# Patient Record
Sex: Female | Born: 1945 | Race: Black or African American | Hispanic: No | Marital: Single | Smoking: Never smoker
Health system: Southern US, Community
[De-identification: ages and names within clinical notes are randomized; demographics above are authoritative.]

## PROBLEM LIST (undated history)

## (undated) ENCOUNTER — Emergency Department (HOSPITAL_COMMUNITY): Discharge status: Absent without leave | Payer: Medicare Other

## (undated) ENCOUNTER — Ambulatory Visit: Discharge status: Absent without leave | Payer: Medicare Other

## (undated) HISTORY — PX: BREAST EXCISIONAL BIOPSY: SUR124

## (undated) HISTORY — PX: ABDOMINAL HYSTERECTOMY: SHX81

## (undated) HISTORY — PX: BREAST BIOPSY: SHX20

---

## 1998-11-16 ENCOUNTER — Other Ambulatory Visit: Admission: RE | Admit: 1998-11-16 | Discharge: 1998-11-16 | Payer: Self-pay | Admitting: Obstetrics and Gynecology

## 2001-02-10 ENCOUNTER — Encounter: Payer: Self-pay | Admitting: Unknown Physician Specialty

## 2001-02-10 ENCOUNTER — Ambulatory Visit (HOSPITAL_COMMUNITY): Admission: RE | Admit: 2001-02-10 | Discharge: 2001-02-10 | Payer: Self-pay | Admitting: Unknown Physician Specialty

## 2003-06-09 ENCOUNTER — Ambulatory Visit (HOSPITAL_COMMUNITY): Admission: RE | Admit: 2003-06-09 | Discharge: 2003-06-09 | Payer: Self-pay | Admitting: Family Medicine

## 2003-06-23 ENCOUNTER — Ambulatory Visit (HOSPITAL_COMMUNITY): Admission: RE | Admit: 2003-06-23 | Discharge: 2003-06-23 | Payer: Self-pay | Admitting: Family Medicine

## 2004-08-28 ENCOUNTER — Ambulatory Visit (HOSPITAL_COMMUNITY): Admission: RE | Admit: 2004-08-28 | Discharge: 2004-08-28 | Payer: Self-pay | Admitting: Nurse Practitioner

## 2004-12-08 ENCOUNTER — Emergency Department (HOSPITAL_COMMUNITY): Admission: EM | Admit: 2004-12-08 | Discharge: 2004-12-08 | Payer: Self-pay | Admitting: Emergency Medicine

## 2005-10-03 ENCOUNTER — Ambulatory Visit (HOSPITAL_COMMUNITY): Admission: RE | Admit: 2005-10-03 | Discharge: 2005-10-03 | Payer: Self-pay | Admitting: Family Medicine

## 2006-10-30 ENCOUNTER — Ambulatory Visit: Payer: Self-pay | Admitting: Internal Medicine

## 2006-11-13 ENCOUNTER — Ambulatory Visit: Payer: Self-pay | Admitting: Internal Medicine

## 2006-11-13 ENCOUNTER — Encounter (INDEPENDENT_AMBULATORY_CARE_PROVIDER_SITE_OTHER): Payer: Self-pay | Admitting: Specialist

## 2006-11-13 ENCOUNTER — Ambulatory Visit (HOSPITAL_COMMUNITY): Admission: RE | Admit: 2006-11-13 | Discharge: 2006-11-13 | Payer: Self-pay | Admitting: Internal Medicine

## 2006-11-25 ENCOUNTER — Ambulatory Visit (HOSPITAL_COMMUNITY): Admission: RE | Admit: 2006-11-25 | Discharge: 2006-11-25 | Payer: Self-pay | Admitting: Family Medicine

## 2008-08-17 ENCOUNTER — Ambulatory Visit (HOSPITAL_COMMUNITY): Admission: RE | Admit: 2008-08-17 | Discharge: 2008-08-17 | Payer: Self-pay | Admitting: Family Medicine

## 2009-12-20 ENCOUNTER — Ambulatory Visit (HOSPITAL_COMMUNITY): Admission: RE | Admit: 2009-12-20 | Discharge: 2009-12-20 | Payer: Self-pay | Admitting: Family Medicine

## 2010-10-20 ENCOUNTER — Ambulatory Visit (HOSPITAL_COMMUNITY)
Admission: RE | Admit: 2010-10-20 | Discharge: 2010-10-20 | Payer: Self-pay | Source: Home / Self Care | Attending: Family Medicine | Admitting: Family Medicine

## 2010-12-26 ENCOUNTER — Other Ambulatory Visit (HOSPITAL_COMMUNITY): Payer: Self-pay | Admitting: Family Medicine

## 2010-12-26 DIAGNOSIS — Z139 Encounter for screening, unspecified: Secondary | ICD-10-CM

## 2011-01-02 ENCOUNTER — Ambulatory Visit (HOSPITAL_COMMUNITY)
Admission: RE | Admit: 2011-01-02 | Discharge: 2011-01-02 | Disposition: A | Discharge status: Home / Self Care | Payer: PRIVATE HEALTH INSURANCE | Source: Ambulatory Visit | Attending: Family Medicine | Admitting: Family Medicine

## 2011-01-02 DIAGNOSIS — Z139 Encounter for screening, unspecified: Secondary | ICD-10-CM

## 2011-01-02 DIAGNOSIS — Z1231 Encounter for screening mammogram for malignant neoplasm of breast: Secondary | ICD-10-CM | POA: Insufficient documentation

## 2011-02-23 NOTE — Op Note (Signed)
Margaret Murray, Margaret Murray             ACCOUNT NO.:  [id number]   MEDICAL RECORD NO.:  [id number]          PATIENT TYPE:  AMB   LOCATION:  DAY                           FACILITY:  [facility]   PHYSICIAN:  R. Roetta Sessions, M.D. DATE OF BIRTH:  May 12, 1946   DATE OF PROCEDURE:  11/13/2006  DATE OF DISCHARGE:                               OPERATIVE REPORT   PROCEDURE:  Diagnostic colonoscopy.   INDICATIONS FOR PROCEDURE:  The patient is a 65 year old lady with no  prior history of colonoscopy, although she has sigmoidoscopy many years  ago.  She has had intermittent severe proctalgia and possible  hematochezia.  This was a month ago.  She tells me the last couple of  weeks her symptoms have subsided.  She is having one bowel movement a  day, has not had any tennismus or rectal bleeding.  Colonoscopy is now  being done.  This approach has been discussed with the patient at  length.  Potential risks, benefits and alternatives was have been  reviewed, questions answered.  She is agreeable.  Please see  documentation on medical record.   PROCEDURE NOTE:  O2 saturation, blood pressure, pulse and respirations  were monitored throughout the entire procedure.   CONSCIOUS SEDATION:  Versed 8 mg IV, Demerol 75 mg IV in divided doses.   INSTRUMENT:  Pentax video chip system.   FINDINGS:  Digital rectal exam revealed no abnormalities.   ENDOSCOPIC FINDINGS:  The prep was excellent.  Examination of rectal  mucosa revealed some submucosal petechiae that were certain  circumferential and involved the distal one half the rectal mucosa.  Please see photos.  This was seen initially and was not related to scope  trauma and not consistent with enema tip trauma.  Examination of the  colonic mucosa from the rectosigmoid junction was undertaken.  The  colonoscope was advanced from the rectosigmoid junction through the left  transverse, right colon to the appendiceal orifice, ileocecal valve and  cecum.  These  structures were well seen photographed.  From this level,  the scope was slowly withdrawn.  All previously mentioned mucosal  surfaces were again seen.  The scope was pulled down into the rectum  where thorough examination of the rectal mucosa including retroflexed  view of the anal verge was undertaken.  Aside from the petechiae, the  rectal mucosa appeared normal.  There were no erosions.  There were no  ulcerations and the vascular pattern was preserved.  The colonic mucosa  appeared entirely normal.  The rectal mucosa was biopsied for histologic  study.  The patient tolerated the procedure well and was reactive.   ENDOSCOPIC IMPRESSION:  1. Distal rectal mucosal petechiae of uncertain significance, was      biopsied, otherwise normal rectum.  2. Normal colon.   RECOMMENDATIONS:  1. Follow-up on path.  2. Further recommendations to follow.      Jonathon Bellows, M.D.  Electronically Signed     RMR/MEDQ  D:  11/13/2006  T:  11/13/2006  Job:  [id number]   cc:   Marchia Meiers  [facility] Dept.  Michell Heinrich,  [place]

## 2011-02-23 NOTE — H&P (Signed)
Margaret Murray, Margaret Murray             ACCOUNT NO.:  [id number]   MEDICAL RECORD NO.:  [id number]         PATIENT TYPE:  AMB   LOCATION:                                FACILITY:  [facility]   PHYSICIAN:  R. Roetta Sessions, M.D. DATE OF BIRTH:  30-Aug-1946   DATE OF ADMISSION:  DATE OF DISCHARGE:  LH                              HISTORY & PHYSICAL   REQUESTING PHYSICIAN:  Dierdre Forth of the [facility]  Department.   CHIEF COMPLAINT:  Margaret Murray is a 65 year old female.  Around December  15th, she developed severe proctalgia.  She felt as if she had swelling  in her rectum.  She noticed something that looked like blood in her  stool.  She had also eaten watermelon that day and is unsure as to  whether this truly was bleeding or not.  She has never had a  colonoscopy.  She had a normal flex sig in 1994.  She denies any  abdominal pain.  Her bowel movements are generally soft and brown on a  daily basis.  She denies any family history of colon cancer.   PAST MEDICAL HISTORY:  Uterine fibroids, status post partial  hysterectomy.  Right benign breast biopsy.   CURRENT MEDICATIONS:  Super [employer] supplements daily.   ALLERGIES:  No known drug allergies.   FAMILY HISTORY:  No known family history of colorectal cancer, liver  problems.  Her father deceased at age 40 secondary to pancreatic  carcinoma.  Mother is alive with diabetes mellitus.  She has seven  healthy siblings.   SOCIAL HISTORY:  Margaret Murray is single.  She lives alone.  She has a  healthy 44 year old child.  She is self-employed.  Sells books.  She  denies any alcohol or drug use.   REVIEW OF SYSTEMS:  CONSTITUTIONAL:  Stable.  Denies any fevers or  chills.  Denies any fatigue.  CARDIOVASCULAR:  Denies any chest pain or  palpitations.  PULMONARY:  Denies dyspnea, cough, hemoptysis.  GI:  See  HPI.  Denies any nausea, vomiting, heartburn, indigestion, dysphagia,  odynophagia, anorexia, or early satiety.   PHYSICAL  EXAMINATION:  VITAL SIGNS:  Weight 134.  Height 62-1/2.  Temp  98.2.  Blood pressure 116/74.  Pulse 88.  GENERAL:  She is a well-developed and well-nourished African-American  female in no acute distress.  HEENT:  Sclerae are clear and nonicteric.  Conjunctivae are pink.  Oropharynx pink and moist without any lesions.  NECK:  Supple.  No mass or thyromegaly.  CHEST:  Heart regular rate and rhythm with normal S1 and S2 without  murmurs, clicks, rubs, or gallops.  LUNGS:  Clear to auscultation bilaterally.  ABDOMEN:  Positive bowel sounds x4.  No bruits auscultated.  Soft,  nontender, nondistended without palpable mass or hepatosplenomegaly.  No  rebound tenderness or guarding.  EXTREMITIES:  Without clubbing or edema bilaterally.  SKIN:  Warm and dry without any rash or jaundice.   IMPRESSION:  Margaret Murray is a 65 year old female with history of severe  proctalgia and possible hematochezia for a brief episode.  I suspect we  are dealing with a benign anorectal source such as hemorrhoids or  fissure; however, she has never had a colonoscopy and given her age,  would pursue further evaluation to rule out colorectal carcinoma.   PLAN:  Colonoscopy with Dr. Jena Gauss in the near future.  I have discussed  the procedure, including risks and benefits to include but not limited  to bleeding, infection, perforation, or drug reaction.  She agrees  __________ and consent will be obtained.   I would like to thank Dierdre Forth, nurse practitioner, for allowing [place]  to participate in the care of Margaret Murray.      Nicholas Lose, N.P.      Jonathon Bellows, M.D.  Electronically Signed    KC/MEDQ  D:  10/30/2006  T:  10/30/2006  Job:  [id number]

## 2011-07-22 ENCOUNTER — Encounter: Payer: Self-pay | Admitting: *Deleted

## 2011-07-22 ENCOUNTER — Emergency Department (HOSPITAL_COMMUNITY)
Admission: EM | Admit: 2011-07-22 | Discharge: 2011-07-22 | Disposition: A | Discharge status: Home / Self Care | Payer: Medicare Other | Attending: Emergency Medicine | Admitting: Emergency Medicine

## 2011-07-22 ENCOUNTER — Emergency Department (HOSPITAL_COMMUNITY): Payer: Medicare Other

## 2011-07-22 DIAGNOSIS — IMO0002 Reserved for concepts with insufficient information to code with codable children: Secondary | ICD-10-CM | POA: Insufficient documentation

## 2011-07-22 DIAGNOSIS — M545 Low back pain, unspecified: Secondary | ICD-10-CM | POA: Insufficient documentation

## 2011-07-22 DIAGNOSIS — M5416 Radiculopathy, lumbar region: Secondary | ICD-10-CM

## 2011-07-22 MED ORDER — PREDNISONE 20 MG PO TABS
40.0000 mg | ORAL_TABLET | Freq: Once | ORAL | Status: AC
  Administered 2011-07-22: 40 mg via ORAL
  Filled 2011-07-22: qty 2

## 2011-07-22 MED ORDER — PREDNISONE 20 MG PO TABS: 40.0000 mg | ORAL_TABLET | Freq: Every day | ORAL | Status: AC

## 2011-07-22 NOTE — ED Provider Notes (Signed)
History     CSN: [id number] Arrival date & time: 07/22/2011  8:59 AM  Chief Complaint  Patient presents with  . Back Pain    (Consider location/radiation/quality/duration/timing/severity/associated sxs/prior treatment) HPI Comments: Pt sat in z recliner chair ~ 2 hrs 2 days ago.  When she got up she had lower back pain which has not improved.  She describes it as constant and burning with radiation into R thigh.  Patient is a 65 y.o. female presenting with back pain. The history is provided by the patient. No language interpreter was used.  Back Pain  This is a new problem. The problem occurs constantly. The problem has not changed since onset.The pain is associated with no known injury. The pain is at a severity of 4/10. The symptoms are aggravated by certain positions. Worse during: worse with transitions of sitting or standing. Pertinent negatives include no fever and no weakness. She has tried nothing for the symptoms.    History reviewed. No pertinent past medical history.  Past Surgical History  Procedure Date  . Breast biopsy     No family history on file.  History  Substance Use Topics  . Smoking status: Never Smoker   . Smokeless tobacco: Not on file  . Alcohol Use: No    OB History    Grav Para Term Preterm Abortions TAB SAB Ect Mult Living                  Review of Systems  Constitutional: Negative for fever.  Musculoskeletal: Positive for back pain.  Neurological: Negative for weakness.  All other systems reviewed and are negative.    Allergies  Review of patient's allergies indicates no known allergies.  Home Medications  No current outpatient prescriptions on file.  BP 103/56  Pulse 69  Temp(Src) 98 F (36.7 C) (Oral)  Resp 16  Ht 5\' 3"  (1.6 m)  Wt 122 lb (55.339 kg)  BMI 21.61 kg/m2  SpO2 98%  Physical Exam  Nursing note and vitals reviewed. Constitutional: She is oriented to person, place, and time. She appears well-developed and  well-nourished. No distress.  HENT:  Head: Normocephalic and atraumatic.  Eyes: EOM are normal.  Neck: Normal range of motion.  Cardiovascular: Normal rate, regular rhythm and normal heart sounds.   Pulmonary/Chest: Effort normal and breath sounds normal.  Abdominal: Soft. She exhibits no distension. There is no tenderness.  Musculoskeletal: She exhibits tenderness.       Back:       Legs: Neurological: She is alert and oriented to person, place, and time. She has normal strength. No sensory deficit. Coordination and gait normal. GCS eye subscore is 4. GCS verbal subscore is 5. GCS motor subscore is 6.  Reflex Scores:      Patellar reflexes are 2+ on the right side and 2+ on the left side.      Achilles reflexes are 2+ on the right side and 2+ on the left side. Skin: Skin is warm and dry.  Psychiatric: She has a normal mood and affect. Judgment normal.    ED Course  Procedures (including critical care time)  Labs Reviewed - No data to display No results found.   No diagnosis found.    MDM          Worthy Rancher, PA 07/22/11 [phone]

## 2011-07-22 NOTE — ED Notes (Signed)
Pt a/ox4. Resp even and unlabored. NAD at this time. D/C instructions and Rx reviewed with pt. Pt verbalized understanding. Pt ambulated with steady gate to POV. 

## 2011-07-22 NOTE — ED Notes (Signed)
Pt states lower back pain since Friday. Unable to straighten back when standing, No known injury. Denies previous back problems. NAD

## 2011-07-22 NOTE — ED Notes (Signed)
Pt c/o pain in her lower back since Friday night. Pt states that she stood up out of the recliner and started hurting. Hurts worse to stand. Alert and oriented x 3. Skin warm and dry. Color pink.

## 2011-07-22 NOTE — ED Provider Notes (Signed)
Medical screening examination/treatment/procedure(s) were performed by non-physician practitioner and as supervising physician I was immediately available for consultation/collaboration.  Glynn Octave, MD 07/22/11 [phone]

## 2011-08-18 ENCOUNTER — Encounter (HOSPITAL_COMMUNITY): Payer: Self-pay | Admitting: *Deleted

## 2011-08-18 ENCOUNTER — Emergency Department (HOSPITAL_COMMUNITY)
Admission: EM | Admit: 2011-08-18 | Discharge: 2011-08-18 | Disposition: A | Discharge status: Home / Self Care | Payer: Medicare Other | Attending: Emergency Medicine | Admitting: Emergency Medicine

## 2011-08-18 DIAGNOSIS — S1093XA Contusion of unspecified part of neck, initial encounter: Secondary | ICD-10-CM | POA: Insufficient documentation

## 2011-08-18 DIAGNOSIS — S0003XA Contusion of scalp, initial encounter: Secondary | ICD-10-CM | POA: Insufficient documentation

## 2011-08-18 DIAGNOSIS — S0033XA Contusion of nose, initial encounter: Secondary | ICD-10-CM

## 2011-08-18 DIAGNOSIS — Z79899 Other long term (current) drug therapy: Secondary | ICD-10-CM | POA: Insufficient documentation

## 2011-08-18 DIAGNOSIS — Z041 Encounter for examination and observation following transport accident: Secondary | ICD-10-CM
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
## 2011-08-18 NOTE — ED Notes (Signed)
Pt alert and oriented x 4 with respirations even and unlabored.  Discharge instructions reviewed with patient and patient verbalized understanding.  No acute distress at this time.  Pt ambulated with steady gait to POV with family.

## 2016-05-16 ENCOUNTER — Other Ambulatory Visit: Payer: Self-pay | Admitting: Internal Medicine

## 2016-05-16 DIAGNOSIS — E2839 Other primary ovarian failure: Secondary | ICD-10-CM

## 2016-05-16 DIAGNOSIS — Z1231 Encounter for screening mammogram for malignant neoplasm of breast: Secondary | ICD-10-CM

## 2016-06-15 ENCOUNTER — Ambulatory Visit
Admission: RE | Admit: 2016-06-15 | Discharge: 2016-06-15 | Disposition: A | Discharge status: Home / Self Care | Payer: Medicare Other | Source: Ambulatory Visit | Attending: Internal Medicine | Admitting: Internal Medicine

## 2016-06-15 DIAGNOSIS — Z1231 Encounter for screening mammogram for malignant neoplasm of breast: Secondary | ICD-10-CM

## 2016-06-15 DIAGNOSIS — E2839 Other primary ovarian failure: Secondary | ICD-10-CM

## 2016-06-21 ENCOUNTER — Telehealth: Payer: Self-pay | Admitting: Internal Medicine

## 2016-06-21 NOTE — Telephone Encounter (Signed)
Received referral to schedule colonoscopy. Records placed on Dr. Marvell FullerGessner's desk for review.

## 2016-06-22 NOTE — Telephone Encounter (Signed)
Dr. Leone PayorGessner reviewed records and Pt is not due for a recall colon until 11/2016.  LM on Vmail to make Pt aware. Pt's records placed in records file folder

## 2016-10-09 ENCOUNTER — Telehealth: Payer: Self-pay | Admitting: Internal Medicine

## 2016-10-09 NOTE — Telephone Encounter (Signed)
Letter mailed to call.  

## 2016-10-09 NOTE — Telephone Encounter (Signed)
RECALL FOR TCS °

## 2016-12-05 ENCOUNTER — Telehealth: Payer: Self-pay | Admitting: Internal Medicine

## 2016-12-05 NOTE — Telephone Encounter (Signed)
[phone]  PATIENT NEEDS A TCS, ITS BEEN OVER 10 YEARS SHE STATES

## 2016-12-10 NOTE — Telephone Encounter (Signed)
Tried to call with no answer  

## 2016-12-11 ENCOUNTER — Other Ambulatory Visit: Payer: Self-pay

## 2016-12-11 DIAGNOSIS — Z1211 Encounter for screening for malignant neoplasm of colon: Secondary | ICD-10-CM

## 2016-12-11 MED ORDER — PEG 3350-KCL-NA BICARB-NACL 420 G PO SOLR
4000.0000 mL | ORAL | 0 refills | Status: DC
Start: 2016-12-11 — End: 2017-05-16

## 2016-12-11 NOTE — Telephone Encounter (Signed)
Ok to schedule.

## 2016-12-11 NOTE — Telephone Encounter (Signed)
Gastroenterology Pre-Procedure Review  Request Date: Requesting Physician:   PATIENT REVIEW QUESTIONS: The patient responded to the following health history questions as indicated:    1. Diabetes Melitis: NO 2. Joint replacements in the past 12 months: NO 3. Major health problems in the past 3 months: NO 4. Has an artificial valve or MVP: NO 5. Has a defibrillator: NO 6. Has been advised in past to take antibiotics in advance of a procedure like teeth cleaning: NO 7. Family history of colon cancer: AUNT PASSED AWAY WITH COLON CANCER 8. Alcohol Use: NO 9. History of sleep apnea: NO 10. History of coronary artery or other vascular stents placed within the last 12 months: NO    MEDICATIONS & ALLERGIES:    Patient reports the following regarding taking any blood thinners:   Plavix? NO Aspirin? NO Coumadin? NO Brilinta? NO Xarelto? NO Eliquis? NO Pradaxa? NO Savaysa? NO Effient? NO  Patient confirms/reports the following medications:  Current Outpatient Prescriptions  Medication Sig Dispense Refill  . Carica Papaya (PAPAYA ENZYMES) CHEW Chew 1 tablet by mouth 2 (two) times daily.      . Echinacea 125 MG CAPS Take 1 capsule by mouth daily.      Marland Kitchen. PRESCRIPTION MEDICATION Take 1 tablet by mouth daily. Bio Astin     . vitamin B-12 (CYANOCOBALAMIN) 1000 MCG tablet Take 1,000 mcg by mouth daily.       No current facility-administered medications for this visit.     Patient confirms/reports the following allergies:  No Known Allergies  No orders of the defined types were placed in this encounter.   AUTHORIZATION INFORMATION Primary Insurance: MEDICARE,  ID #: [id number],  Group #:  Pre-Cert / Auth required:  Pre-Cert / Auth #:   SCHEDULE INFORMATION: Procedure has been scheduled as follows:  Date: , Time:   Location:   This Gastroenterology Pre-Precedure Review Form is being routed to the following provider(s): ROURK

## 2016-12-11 NOTE — Telephone Encounter (Signed)
Pt is set up for TCS on 01/23/17 @ 8:30. She is aware.

## 2016-12-11 NOTE — Telephone Encounter (Signed)
NO PA is needed 

## 2017-01-23 ENCOUNTER — Encounter (HOSPITAL_COMMUNITY)
Admission: RE | Disposition: A | Discharge status: Home / Self Care | Payer: Self-pay | Source: Ambulatory Visit | Attending: Internal Medicine

## 2017-01-23 ENCOUNTER — Ambulatory Visit (HOSPITAL_COMMUNITY)
Admission: RE | Admit: 2017-01-23 | Discharge: 2017-01-23 | Disposition: A | Discharge status: Home / Self Care | Payer: Medicare Other | Source: Ambulatory Visit | Attending: Internal Medicine | Admitting: Internal Medicine

## 2017-01-23 ENCOUNTER — Encounter (HOSPITAL_COMMUNITY): Payer: Self-pay | Admitting: *Deleted

## 2017-01-23 DIAGNOSIS — Z1211 Encounter for screening for malignant neoplasm of colon: Secondary | ICD-10-CM | POA: Diagnosis present

## 2017-01-23 DIAGNOSIS — Z1212 Encounter for screening for malignant neoplasm of rectum: Secondary | ICD-10-CM

## 2017-01-23 HISTORY — PX: COLONOSCOPY: SHX5424

## 2017-01-23 SURGERY — COLONOSCOPY
Anesthesia: Moderate Sedation

## 2017-01-23 MED ORDER — STERILE WATER FOR IRRIGATION IR SOLN
Status: DC | PRN
  Administered 2017-01-23: 09:00:00

## 2017-01-23 MED ORDER — SODIUM CHLORIDE 0.9 % IV SOLN
INTRAVENOUS | Status: DC
  Administered 2017-01-23: 1000 mL via INTRAVENOUS

## 2017-01-23 MED ORDER — MIDAZOLAM HCL 5 MG/5ML IJ SOLN
INTRAMUSCULAR | Status: DC | PRN
  Administered 2017-01-23: 2 mg via INTRAVENOUS
  Administered 2017-01-23 (×3): 1 mg via INTRAVENOUS

## 2017-01-23 MED ORDER — MEPERIDINE HCL 100 MG/ML IJ SOLN
INTRAMUSCULAR | Status: DC | PRN
  Administered 2017-01-23: 50 mg via INTRAVENOUS
  Administered 2017-01-23: 25 mg via INTRAVENOUS

## 2017-01-23 MED ORDER — ONDANSETRON HCL 4 MG/2ML IJ SOLN
INTRAMUSCULAR | Status: AC
  Filled 2017-01-23: qty 2

## 2017-01-23 MED ORDER — MIDAZOLAM HCL 5 MG/5ML IJ SOLN
INTRAMUSCULAR | Status: AC
  Filled 2017-01-23: qty 10

## 2017-01-23 MED ORDER — ONDANSETRON HCL 4 MG/2ML IJ SOLN
INTRAMUSCULAR | Status: DC | PRN
  Administered 2017-01-23: 4 mg via INTRAVENOUS

## 2017-01-23 MED ORDER — MEPERIDINE HCL 100 MG/ML IJ SOLN
INTRAMUSCULAR | Status: AC
  Filled 2017-01-23: qty 2

## 2017-01-23 NOTE — Discharge Instructions (Addendum)
. °  Colonoscopy °Discharge Instructions ° °Read the instructions outlined below and refer to this sheet in the next few weeks. These discharge instructions provide you with general information on caring for yourself after you leave the hospital. Your doctor may also give you specific instructions. While your treatment has been planned according to the most current medical practices available, unavoidable complications occasionally occur. If you have any problems or questions after discharge, call Dr. Rourk at [phone]. °ACTIVITY °· You may resume your regular activity, but move at a slower pace for the next 24 hours.  °· Take frequent rest periods for the next 24 hours.  °· Walking will help get rid of the air and reduce the bloated feeling in your belly (abdomen).  °· No driving for 24 hours (because of the medicine (anesthesia) used during the test).   °· Do not sign any important legal documents or operate any machinery for 24 hours (because of the anesthesia used during the test).  °NUTRITION °· Drink plenty of fluids.  °· You may resume your normal diet as instructed by your doctor.  °· Begin with a light meal and progress to your normal diet. Heavy or fried foods are harder to digest and may make you feel sick to your stomach (nauseated).  °· Avoid alcoholic beverages for 24 hours or as instructed.  °MEDICATIONS °· You may resume your normal medications unless your doctor tells you otherwise.  °WHAT YOU CAN EXPECT TODAY °· Some feelings of bloating in the abdomen.  °· Passage of more gas than usual.  °· Spotting of blood in your stool or on the toilet paper.  °IF YOU HAD POLYPS REMOVED DURING THE COLONOSCOPY: °· No aspirin products for 7 days or as instructed.  °· No alcohol for 7 days or as instructed.  °· Eat a soft diet for the next 24 hours.  °FINDING OUT THE RESULTS OF YOUR TEST °Not all test results are available during your visit. If your test results are not back during the visit, make an appointment  with your caregiver to find out the results. Do not assume everything is normal if you have not heard from your caregiver or the medical facility. It is important for you to follow up on all of your test results.  °SEEK IMMEDIATE MEDICAL ATTENTION IF: °· You have more than a spotting of blood in your stool.  °· Your belly is swollen (abdominal distention).  °· You are nauseated or vomiting.  °· You have a temperature over 101.  °· You have abdominal pain or discomfort that is severe or gets worse throughout the day.  ° °I do not recommend a future colonoscopy unless new symptoms develop. ° ° ° °

## 2017-01-23 NOTE — Op Note (Addendum)
[facility] Patient Name: Margaret Murray Procedure Date: 01/23/2017 8:29 AM MRN: [id number] Date of Birth: 02-26-1946 Attending MD: Gennette Pac , MD CSN: [id number] Age: 71 Admit Type: Outpatient Procedure:                Colonoscopy Indications:              Screening for colorectal malignant neoplasm Providers:                Gennette Pac, MD, Loma Messing B. Mathis Fare RN, RN,                            Burke Keels, Technician Referring MD:              Medicines:                Midazolam 5 mg IV, Meperidine 75 mg IV, Ondansetron                            4 mg IV Complications:            No immediate complications. Estimated Blood Loss:     Estimated blood loss: none. Procedure:                Pre-Anesthesia Assessment:                           - Prior to the procedure, a History and Physical                            was performed, and patient medications and                            allergies were reviewed. The patient's tolerance of                            previous anesthesia was also reviewed. The risks                            and benefits of the procedure and the sedation                            options and risks were discussed with the patient.                            All questions were answered, and informed consent                            was obtained. Prior Anticoagulants: The patient has                            taken no previous anticoagulant or antiplatelet                            agents. ASA Grade Assessment: II - A patient with  mild systemic disease. After reviewing the risks                            and benefits, the patient was deemed in                            satisfactory condition to undergo the procedure.                           After obtaining informed consent, the colonoscope                            was passed under direct vision. Throughout the                            procedure,  the patient's blood pressure, pulse, and                            oxygen saturations were monitored continuously. The                            Colonoscope was introduced through the anus and                            advanced to the the cecum, identified by                            appendiceal orifice and ileocecal valve. The                            ileocecal valve, appendiceal orifice, and rectum                            were photographed. The entire colon was well                            visualized. The colonoscopy was performed without                            difficulty. The patient tolerated the procedure                            well. The quality of the bowel preparation was                            adequate. Scope In: 8:50:05 AM Scope Out: 9:06:25 AM Scope Withdrawal Time: 0 hours 8 minutes 2 seconds  Total Procedure Duration: 0 hours 16 minutes 20 seconds  Findings:      The perianal and digital rectal examinations were normal.      The entire examined colon appeared normal on direct and retroflexion       views. Impression:               - The entire examined colon is normal on direct and  retroflexion views.                           - No specimens collected. Moderate Sedation:      Moderate (conscious) sedation was administered by the endoscopy nurse       and supervised by the endoscopist. The following parameters were       monitored: oxygen saturation, heart rate, blood pressure, respiratory       rate, EKG, adequacy of pulmonary ventilation, and response to care.       Total physician intraservice time was 23 minutes. Recommendation:           - Patient has a contact number available for                            emergencies. The signs and symptoms of potential                            delayed complications were discussed with the                            patient. Return to normal activities tomorrow.                             Written discharge instructions were provided to the                            patient.                           - Resume previous diet.                           - Continue present medications.                           - No repeat colonoscopy due to age.                           - Return to GI clinic PRN. Procedure Code(s):        --- Professional ---                           [phone], Colonoscopy, flexible; diagnostic, including                            collection of specimen(s) by brushing or washing,                            when performed (separate procedure)                           99152, Moderate sedation services provided by the                            same physician or other qualified health care  professional performing the diagnostic or                            therapeutic service that the sedation supports,                            requiring the presence of an independent trained                            observer to assist in the monitoring of the                            patient's level of consciousness and physiological                            status; initial 15 minutes of intraservice time,                            patient age 53 years or older                           [phone], Moderate sedation services; each additional                            15 minutes intraservice time Diagnosis Code(s):        --- Professional ---                           Z12.11, Encounter for screening for malignant                            neoplasm of colon CPT copyright 2016 [employer]. All rights reserved. The codes documented in this report are preliminary and upon coder review may  be revised to meet current compliance requirements. Gerrit Friends. Mehul Rudin, MD Gennette Pac, MD 01/23/2017 9:18:31 AM This report has been signed electronically. Number of Addenda: 0

## 2017-01-23 NOTE — H&P (Signed)
 @   Primary Care Physician:  Dorrene German, MD Primary Gastroenterologist:  Dr. Jena Gauss  Pre-Procedure History & Physical: HPI:  Margaret Murray is a 71 y.o. female is here for a screening colonoscopy.  Negative colonoscopy 10 years ago. No bowel symptoms. No family history colon cancer in any first-degree relatives.  History reviewed. No pertinent past medical history.  Past Surgical History:  Procedure Laterality Date  . ABDOMINAL HYSTERECTOMY    . BREAST BIOPSY      Prior to Admission medications   Medication Sig Start Date End Date Taking? Authorizing Provider  BORON PO Take 75 mg by mouth daily.   Yes Historical Provider, MD  Cholecalciferol (VITAMIN D3 PO) Take 1 capsule by mouth daily.   Yes Historical Provider, MD  Echinacea 450 MG CAPS Take 450 mg by mouth daily.   Yes Historical Provider, MD  Hyaluronic Acid-Vitamin C (HYALURONIC ACID PO) Take 2 capsules by mouth daily.   Yes Historical Provider, MD  Lutein 20 MG TABS Take 20 mg by mouth daily.   Yes Historical Provider, MD  Misc Natural Products (PROGESTERONE EX) Apply 1 application topically as directed. Apply daily on days 1-21 of the month then stop for the rest of the month   Yes Historical Provider, MD  polyethylene glycol-electrolytes (TRILYTE) 420 g solution Take 4,000 mLs by mouth as directed. 12/11/16  Yes Corbin Ade, MD  psyllium (METAMUCIL) 58.6 % powder 1 tablespoon at bedtime   Yes Historical Provider, MD  TURMERIC PO Take 600 mg by mouth daily.   Yes Historical Provider, MD    Allergies as of 12/11/2016  . (No Known Allergies)    Family History  Problem Relation Age of Onset  . Diabetes Mother   . Dementia Mother   . Pancreatic cancer Father   . Hyperthyroidism Sister   . Hypertension Sister   . Crohn's disease Brother   . Multiple sclerosis Sister     Social History   Social History  . Marital status: Single    Spouse name: N/A  . Number of children: N/A  . Years of education: N/A    Occupational History  . Not on file.   Social History Main Topics  . Smoking status: Never Smoker  . Smokeless tobacco: Never Used  . Alcohol use No  . Drug use: No  . Sexual activity: Not on file   Other Topics Concern  . Not on file   Social History Narrative  . No narrative on file    Review of Systems: See HPI, otherwise negative ROS  Physical Exam: BP 119/60   Pulse 74   Temp 98.3 F (36.8 C) (Oral)   Resp 13   Ht  (1.575 m)   Wt 128 lb (58.1 kg)   SpO2 99%   BMI 23.41 kg/m  General:   Alert,  Well-developed, well-nourished, pleasant and cooperative in NAD Neck:  Supple; no masses or thyromegaly. Lungs:  Clear throughout to auscultation.   No wheezes, crackles, or rhonchi. No acute distress. Heart:  Regular rate and rhythm; no murmurs, clicks, rubs,  or gallops. Abdomen:  Soft, nontender and nondistended. No masses, hepatosplenomegaly or hernias noted. Normal bowel sounds, without guarding, and without rebound.   .  Impression: Margaret Murray is now here to undergo a screening colonoscopy.  Average risk screening examination.  Recommendations:  I have offered the patient a screening colonoscopy today. Risks, benefits, limitations, imponderables and alternatives regarding colonoscopy have been reviewed with  the patient. Questions have been answered. All parties agreeable.     Notice:  This dictation was prepared with Dragon dictation along with smaller phrase technology. Any transcriptional errors that result from this process are unintentional and may not be corrected upon review.

## 2017-01-25 ENCOUNTER — Encounter (HOSPITAL_COMMUNITY): Payer: Self-pay | Admitting: Internal Medicine

## 2017-05-06 ENCOUNTER — Ambulatory Visit: Payer: Medicare Other | Admitting: Podiatry

## 2017-05-16 ENCOUNTER — Ambulatory Visit (INDEPENDENT_AMBULATORY_CARE_PROVIDER_SITE_OTHER): Payer: Medicare Other

## 2017-05-16 ENCOUNTER — Ambulatory Visit (INDEPENDENT_AMBULATORY_CARE_PROVIDER_SITE_OTHER): Payer: Medicare Other | Admitting: Podiatry

## 2017-05-16 ENCOUNTER — Encounter: Payer: Self-pay | Admitting: Podiatry

## 2017-05-16 VITALS — BP 115/71 | HR 82

## 2017-05-16 DIAGNOSIS — M205X1 Other deformities of toe(s) (acquired), right foot: Secondary | ICD-10-CM | POA: Diagnosis not present

## 2017-05-16 DIAGNOSIS — M21619 Bunion of unspecified foot: Secondary | ICD-10-CM

## 2017-05-16 NOTE — Progress Notes (Signed)
Subjective:    Patient ID: Margaret Murray, female    DOB: 04/08/1946, 71 y.o.   MRN: [id number]  HPI  Chief Complaint  Patient presents with  . Bunions    b/l - right worse than left. Concerned right foot is causing pain in right calf  . Ankle Pain    B/L - feels like a ligament is pulling on lateral side of ankle    Ms. Mccomber Presents the op city for concerns of painful bunions to both of her feet with the right side more significant than left. She states this is been ongoing for several years and worsening of last 3-4 months. She states that she is walking differently because of the pain to her right foot which started in constant pain of the os aspect of her ankle and she is convinced that this is coming from her big toe and the way she is walking. She has tried changing her shoes without any significant improvement. Today she presents requesting surgical intervention for her bunion on the right foot. She denies any claudication symptoms. No numbness or tingling. She has no other concerns today.   Review of Systems  Musculoskeletal:       Calf pain   All other systems reviewed and are negative.      Objective:   Physical Exam General: AAO x3, NAD  Dermatological: Skin is warm, dry and supple bilateral. Nails x 10 are well manicured; remaining integument appears unremarkable at this time. There are no open sores, no preulcerative lesions, no rash or signs of infection present.  Vascular: Dorsalis Pedis artery and Posterior Tibial artery pedal pulses are 2/4 bilateral with immedate capillary fill time.  There is no pain with calf compression, swelling, warmth, erythema.   Neruologic: Grossly intact via light touch bilateral.  Protective threshold with Semmes Wienstein monofilament intact to all pedal sites bilateral.   Musculoskeletal: Moderate HAV is present bilaterally. There is greatly decreased range of motion on the right side and there is crepitation with range of motion.  There is no first ray hypermobility present bilaterally. Range of motion seems to be adequate and the left side however there is some mild crepitation. There is trace edema to the first MTPJ on the right side there is no erythema or increase in warmth. There is tenderness on the first MPJ on the right foot and on the bunion site. There is mild erythema on the bunion from irritation in shoes. No other areas of tenderness identified bilaterally. Muscular strength 5/5 in all groups tested bilateral.  Gait: Unassisted, Nonantalgic.      Assessment & Plan:  71 year old female symptomatic RIGHT HAV, hallux limitus -Treatment options discussed including all alternatives, risks, and complications -Etiology of symptoms were discussed -X-rays were obtained and reviewed with the patient. Arthritic changes present the first MTPJ. Moderate HAV is present. There is no evidence of acute fracture. -Discussed both conservative and surgical treatment options. We discussed changing shoes, orthotics, offloading padding as steroid injections. At this point she is requesting surgical intervention giving her pain. She is convinced also that by doing conservative treatment she is, continued Pain and ultimately she is going to need surgery. I discussed with her surgical intervention and different treatment options. Given the decreased range of motion the first MPJ crepitation I recommended first MPJ arthrodesis. I discussed with the surgery as well as the postoperative course. I discussed this is not a guarantee resolution of symptoms and he can make pain worse and  I am not doing this for cosmetic reasons and this is certainly for pain. She understands this and she wishes to go ahead and proceed with surgery. -The incision placement as well as the postoperative course was discussed with the patient. I discussed risks of the surgery which include, but not limited to, infection, bleeding, pain, swelling, need for further surgery,  delayed or nonhealing, painful or ugly scar, numbness or sensation changes, over/under correction, recurrence, transfer lesions, further deformity, hardware failure, DVT/PE, loss of toe/foot. Patient understands these risks and wishes to proceed with surgery. The surgical consent was reviewed with the patient all 3 pages were signed. No promises or guarantees were given to the outcome of the procedure. All questions were answered to the best of my ability. Before the surgery the patient was encouraged to call the office if there is any further questions. The surgery will be performed at the [facility] on an outpatient basis. -CAM boot dispensed for post-op -Ordered arterial and venous studies to be done prior to surgery  Ovid Curd, dPM

## 2017-05-16 NOTE — Patient Instructions (Signed)

## 2017-05-17 ENCOUNTER — Other Ambulatory Visit: Payer: Self-pay | Admitting: *Deleted

## 2017-05-17 DIAGNOSIS — R0989 Other specified symptoms and signs involving the circulatory and respiratory systems: Secondary | ICD-10-CM

## 2017-05-17 DIAGNOSIS — I82401 Acute embolism and thrombosis of unspecified deep veins of right lower extremity: Secondary | ICD-10-CM

## 2017-05-22 ENCOUNTER — Other Ambulatory Visit: Payer: Self-pay | Admitting: Internal Medicine

## 2017-05-24 ENCOUNTER — Telehealth: Payer: Self-pay | Admitting: Podiatry

## 2017-05-24 NOTE — Telephone Encounter (Signed)
"  I need to speak to Misty Stanley." She is currently with patient's right now but I can get a message to her to have her call you back. "My name is Kayrene Pignatelli and my date of birth is 04-15-1946." May I ask what you need to speak to her about? "Do I have to tell you?" I said I was just asking to give her an idea of why she was calling but she didn't have to. I then asked what her best call back number was and she replied [phone]." I said thank you and I would get the message to [place].

## 2017-05-27 NOTE — Telephone Encounter (Signed)
I called patient back on Friday and the patient stated that she has changed her mind about having surgery and if she changes her mind she will call the office back and reschedule. Margaret Murray

## 2017-05-29 ENCOUNTER — Encounter: Payer: Self-pay | Admitting: Podiatry

## 2017-05-29 ENCOUNTER — Ambulatory Visit (INDEPENDENT_AMBULATORY_CARE_PROVIDER_SITE_OTHER): Payer: Medicare Other | Admitting: Podiatry

## 2017-05-29 VITALS — Ht 62.0 in | Wt 124.0 lb

## 2017-05-29 DIAGNOSIS — M21969 Unspecified acquired deformity of unspecified lower leg: Secondary | ICD-10-CM | POA: Diagnosis not present

## 2017-05-29 DIAGNOSIS — M21619 Bunion of unspecified foot: Secondary | ICD-10-CM | POA: Diagnosis not present

## 2017-05-29 DIAGNOSIS — M79672 Pain in left foot: Secondary | ICD-10-CM | POA: Diagnosis not present

## 2017-05-29 DIAGNOSIS — M79671 Pain in right foot: Secondary | ICD-10-CM | POA: Diagnosis not present

## 2017-05-29 DIAGNOSIS — M205X1 Other deformities of toe(s) (acquired), right foot: Secondary | ICD-10-CM

## 2017-05-29 NOTE — Patient Instructions (Signed)
Seen for painful bunions. Noted of weak first metatarsal bone with lateral weight shifting. Reviewed conservative and surgical options, fusion of the first MCJ with bunionectomy. Need custom orthotics.

## 2017-05-29 NOTE — Progress Notes (Signed)
SUBJECTIVE: 71 y.o. year old female presents stating that she has "hallux valgus" problem on both feet.  Right foot started to bothers her more than the left.  REVIEW OF SYSTEMS: Pertinent items noted in HPI and remainder of comprehensive ROS otherwise negative.  OBJECTIVE: DERMATOLOGIC EXAMINATION: Normal findings.  VASCULAR EXAMINATION OF LOWER LIMBS: All pedal pulses are palpable with normal pulsation.  Capillary Filling times within 3 seconds in all digits.  No edema or erythema noted. Temperature gradient from tibial crest to dorsum of foot is within normal bilateral.  NEUROLOGIC EXAMINATION OF THE LOWER LIMBS: Achilles DTR is present and within normal. Monofilament (Semmes-Weinstein 10-gm) sensory testing positive 6 out of 6, bilateral. Vibratory sensations(128Hz  turning fork) intact at medial and lateral forefoot bilateral.  Sharp and Dull discriminatory sensations at the plantar ball of hallux is intact bilateral.   MUSCULOSKELETAL EXAMINATION: Positive for excess sagittal plane motion of the first ray R>L. Enlarged 1st metatarsal head bilateral. Limited first MPJ motion with pain upon dorsiflexion. Excess forefoot and rearfoot pronation upon weight bearing.  X-rays were done at her previous doctor's visit: Findings show severe dorsal displacement of the first metatarsal, enlarged medial eminence, excess valgus deviation of hallux, increased first intermetatarsal angle bilateral.  ASSESSMENT: Hallux valgus with bunion deformity. Hypermobile first ray bilateral. STJ hyperpronation bilateral.  PLAN: Reviewed clinical findings and available treatment options, conservative and surgical, fusion of the first metatarsocuneiform joint with bunionectomy. Patient will return for pre op after busy summer. Advised to return for custom orthotics before scheduling for surgery.

## 2017-06-17 ENCOUNTER — Other Ambulatory Visit: Payer: Self-pay | Admitting: Internal Medicine

## 2017-06-17 DIAGNOSIS — Z1231 Encounter for screening mammogram for malignant neoplasm of breast: Secondary | ICD-10-CM

## 2017-06-25 ENCOUNTER — Ambulatory Visit: Payer: Medicare Other

## 2017-07-25 ENCOUNTER — Ambulatory Visit
Admission: RE | Admit: 2017-07-25 | Discharge: 2017-07-25 | Disposition: A | Discharge status: Home / Self Care | Payer: Medicare Other | Source: Ambulatory Visit | Attending: Internal Medicine | Admitting: Internal Medicine

## 2017-07-25 DIAGNOSIS — Z1231 Encounter for screening mammogram for malignant neoplasm of breast: Secondary | ICD-10-CM

## 2018-05-23 ENCOUNTER — Other Ambulatory Visit (HOSPITAL_COMMUNITY): Payer: Self-pay | Admitting: Internal Medicine

## 2018-05-23 ENCOUNTER — Ambulatory Visit (HOSPITAL_COMMUNITY)
Admission: RE | Admit: 2018-05-23 | Discharge: 2018-05-23 | Disposition: A | Discharge status: Home / Self Care | Payer: Medicare Other | Source: Ambulatory Visit | Attending: Vascular Surgery | Admitting: Vascular Surgery

## 2018-05-23 DIAGNOSIS — M7989 Other specified soft tissue disorders: Secondary | ICD-10-CM | POA: Insufficient documentation

## 2018-05-23 DIAGNOSIS — I739 Peripheral vascular disease, unspecified: Secondary | ICD-10-CM

## 2018-06-23 ENCOUNTER — Telehealth: Payer: Self-pay | Admitting: *Deleted

## 2018-06-23 NOTE — Telephone Encounter (Signed)
Returned patients phone call. Patient wanted to have custom molded orthotics made. She asked if when the appointment is scheduled should she be prepared to get them done the same day. I did inform her that we usually call the insurance to see what the benefits are on the orthotics. She states her insurance does not cover them. I also informed the patient that the price of the orthotics is $398. Patient states she paid $300 in the past. Informed patient that we no longer write off the 98 and the total cost to the patient is $398. Patient states she will call us back.

## 2018-08-08 ENCOUNTER — Other Ambulatory Visit: Payer: Self-pay | Admitting: Internal Medicine

## 2018-08-08 DIAGNOSIS — Z1231 Encounter for screening mammogram for malignant neoplasm of breast: Secondary | ICD-10-CM

## 2018-08-19 ENCOUNTER — Ambulatory Visit
Admission: RE | Admit: 2018-08-19 | Discharge: 2018-08-19 | Disposition: A | Discharge status: Home / Self Care | Payer: Medicare Other | Source: Ambulatory Visit | Attending: Internal Medicine | Admitting: Internal Medicine

## 2018-08-19 DIAGNOSIS — Z1231 Encounter for screening mammogram for malignant neoplasm of breast: Secondary | ICD-10-CM

## 2019-07-21 ENCOUNTER — Other Ambulatory Visit: Payer: Self-pay

## 2019-07-21 ENCOUNTER — Telehealth: Payer: Self-pay | Admitting: Orthopedic Surgery

## 2019-07-21 ENCOUNTER — Ambulatory Visit (INDEPENDENT_AMBULATORY_CARE_PROVIDER_SITE_OTHER): Payer: Medicare Other

## 2019-07-21 ENCOUNTER — Ambulatory Visit
Admission: EM | Admit: 2019-07-21 | Discharge: 2019-07-21 | Disposition: A | Discharge status: Home / Self Care | Payer: Medicare Other | Attending: Emergency Medicine | Admitting: Emergency Medicine

## 2019-07-21 DIAGNOSIS — S161XXA Strain of muscle, fascia and tendon at neck level, initial encounter: Secondary | ICD-10-CM

## 2019-07-21 DIAGNOSIS — W19XXXA Unspecified fall, initial encounter: Secondary | ICD-10-CM | POA: Diagnosis not present

## 2019-07-21 DIAGNOSIS — M542 Cervicalgia: Secondary | ICD-10-CM

## 2019-07-21 NOTE — ED Provider Notes (Signed)
[facility]   [id number] 07/21/19 Arrival Time: 1006  CC: Neck PAIN  SUBJECTIVE: History from: patient. Margaret Murray is a 73 y.o. female complains of neck pain that began this morning.  Symptoms began after she was stretching and she tumbled over and heard "5 cracks" in her neck.  States she got pinned between her bed and desk for a few seconds.  Localizes the pain to the bilateral cervical musculatures.  Describes the pain as intermittent and worse with ROM.  Has NOT tried OTC medications.  Symptoms are made worse with neck ROM.  Denies similar symptoms in the past.  Denies fever, chills, head trauma, headaches, nausea, vomiting, LOC, vision changes, lightheadedness, dizziness, erythema, ecchymosis, effusion, weakness, numbness and tingling.    ROS: As per HPI.  All other pertinent ROS negative.     History reviewed. No pertinent past medical history. Past Surgical History:  Procedure Laterality Date  . ABDOMINAL HYSTERECTOMY    . BREAST BIOPSY    . BREAST EXCISIONAL BIOPSY Right   . COLONOSCOPY N/A 01/23/2017   Procedure: COLONOSCOPY;  Surgeon: Corbin Ade, MD;  Location: [facility];  Service: Endoscopy;  Laterality: N/A;  8:30   No Known Allergies No current facility-administered medications on file prior to encounter.    Current Outpatient Medications on File Prior to Encounter  Medication Sig Dispense Refill  . BORON PO Take 75 mg by mouth daily.    . Cholecalciferol (VITAMIN D3 PO) Take 1 capsule by mouth daily.    . Echinacea 450 MG CAPS Take 450 mg by mouth daily.    Marland Kitchen Hyaluronic Acid-Vitamin C (HYALURONIC ACID PO) Take 2 capsules by mouth daily.    . Lutein 20 MG TABS Take 20 mg by mouth daily.    . Misc Natural Products (PROGESTERONE EX) Apply 1 application topically as directed. Apply daily on days 1-21 of the month then stop for the rest of the month    . TURMERIC PO Take 600 mg by mouth daily.     Social History   Socioeconomic History  .  Marital status: Single    Spouse name: Not on file  . Number of children: Not on file  . Years of education: Not on file  . Highest education level: Not on file  Occupational History  . Not on file  Social Needs  . Financial resource strain: Not on file  . Food insecurity    Worry: Not on file    Inability: Not on file  . Transportation needs    Medical: Not on file    Non-medical: Not on file  Tobacco Use  . Smoking status: Never Smoker  . Smokeless tobacco: Never Used  Substance and Sexual Activity  . Alcohol use: No  . Drug use: No  . Sexual activity: Not on file  Lifestyle  . Physical activity    Days per week: Not on file    Minutes per session: Not on file  . Stress: Not on file  Relationships  . Social Musician on phone: Not on file    Gets together: Not on file    Attends religious service: Not on file    Active member of club or organization: Not on file    Attends meetings of clubs or organizations: Not on file    Relationship status: Not on file  . Intimate partner violence    Fear of current or ex partner: Not on file  Emotionally abused: Not on file    Physically abused: Not on file    Forced sexual activity: Not on file  Other Topics Concern  . Not on file  Social History Narrative  . Not on file   Family History  Problem Relation Age of Onset  . Diabetes Mother   . Dementia Mother   . Pancreatic cancer Father   . Hyperthyroidism Sister   . Hypertension Sister   . Crohn's disease Brother   . Multiple sclerosis Sister   . Breast cancer Neg Hx     OBJECTIVE:  Vitals:   07/21/19 1017  BP: (!) 158/93  Pulse: 68  Resp: 16  Temp: 98.5 F (36.9 C)  TempSrc: Oral  SpO2: 99%    General appearance: ALERT; in no acute distress.  Head: NCAT Lungs: Normal respiratory effort; CTAB CV: RRR Musculoskeletal: neck  Inspection: Skin warm, dry, clear and intact without obvious erythema, effusion, or ecchymosis.  Palpation: Mildly TTP  over bilateral cervical musculature; No midline tenderness ROM: FROM active and passive Strength: 5/5 shld abduction, 5/5 shld adduction, 5/5 elbow flexion, 5/5 elbow extension, 5/5 grip strength Skin: warm and dry Neurologic: Ambulates without difficulty; Sensation intact about the upper extremities Psychological: alert and cooperative; normal mood and affect  DIAGNOSTIC STUDIES:  Dg Cervical Spine Complete  Result Date: 07/21/2019 CLINICAL DATA:  Fall.  Neck injury. EXAM: CERVICAL SPINE - COMPLETE 4+ VIEW COMPARISON:  No recent prior. FINDINGS: Diffuse severe multilevel degenerative change. Loss of normal cervical lordosis. Ligamentous ossification. No evidence of fracture or dislocation. IMPRESSION: Diffuse severe multilevel degenerative change with loss of normal cervical lordosis. No acute bony abnormality. Electronically Signed   By: Marcello Moores  Register   On: 07/21/2019 11:11    My interpretation:  X-rays negative for bony abnormalities including fracture, or dislocation.  No soft tissue swelling.    I have reviewed the x-rays myself and the radiologist interpretation. I am in agreement with the radiologist interpretation.     ASSESSMENT & PLAN:  1. Strain of neck muscle, initial encounter    X-rays did not show fracture or dislocation Continue conservative management of rest, ice, heat and gentle stretches/ massage Use OTC ibuprofen and/or tylenol as needed for pain and inflammation Follow up with PCP if symptoms persist Return or go to the ER if you have any new or worsening symptoms (fever, chills, chest pain, abdominal pain, changes in bowel or bladder habits, pain radiating into arms, etc...)    Reviewed expectations re: course of current medical issues. Questions answered. Outlined signs and symptoms indicating need for more acute intervention. Patient verbalized understanding. After Visit Summary given.    Lestine Box, PA-C 07/21/19 1230

## 2019-07-21 NOTE — Telephone Encounter (Signed)
Patient walked in this morning stating she was exercising and went down too far, she rolled over onto her neck.   She said she heard a pop and crack.  She wanted to see Dr. Aline Brochure and to get an x-ray.  I told her that Dr. Aline Brochure was not in the office today.  She asked about Dr. Luna Glasgow.  I told her that Dr. Luna Glasgow did not have any openings today.  She really wanted to see someone today.  I spoke to my supervisor, Brand Males and she suggested telling the patient to go to [facility].  Ms. Spanbauer said she would do this.

## 2019-07-21 NOTE — ED Triage Notes (Signed)
While doing morning stretches she lost balance and fell on neck, c/o neck pain bilaterally

## 2019-07-21 NOTE — Discharge Instructions (Signed)
X-rays did not show fracture or dislocation Continue conservative management of rest, ice, heat and gentle stretches/ massage Use OTC ibuprofen and/or tylenol as needed for pain and inflammation Follow up with PCP if symptoms persist Return or go to the ER if you have any new or worsening symptoms (fever, chills, chest pain, abdominal pain, changes in bowel or bladder habits, pain radiating into arms, etc...)

## 2019-08-20 ENCOUNTER — Other Ambulatory Visit: Payer: Self-pay | Admitting: Internal Medicine

## 2019-08-20 DIAGNOSIS — Z1231 Encounter for screening mammogram for malignant neoplasm of breast: Secondary | ICD-10-CM

## 2019-10-14 ENCOUNTER — Ambulatory Visit: Payer: Medicare Other

## 2019-12-08 ENCOUNTER — Ambulatory Visit: Payer: Medicare Other

## 2020-01-04 ENCOUNTER — Ambulatory Visit
Admission: RE | Admit: 2020-01-04 | Discharge: 2020-01-04 | Disposition: A | Discharge status: Home / Self Care | Payer: Medicare Other | Source: Ambulatory Visit | Attending: Internal Medicine | Admitting: Internal Medicine

## 2020-01-04 ENCOUNTER — Other Ambulatory Visit: Payer: Self-pay

## 2020-01-04 DIAGNOSIS — Z1231 Encounter for screening mammogram for malignant neoplasm of breast: Secondary | ICD-10-CM

## 2020-02-04 ENCOUNTER — Telehealth: Payer: Self-pay | Admitting: Emergency Medicine

## 2020-02-04 NOTE — Telephone Encounter (Signed)
Pt called verified name and dob Pt wanted to know if she needed another colonoscopy. Notified pt that after reviewing the notes doctor rourk stated due to her age and the fact there were not any polyps detected he did not recommend  a f/u for colonoscopy. Pt stated she understood

## 2020-03-28 ENCOUNTER — Telehealth: Payer: Self-pay

## 2020-03-28 NOTE — Telephone Encounter (Signed)
Tried to call pt- NA-LMOM to return call.  

## 2020-03-28 NOTE — Telephone Encounter (Signed)
Pt stopped by the office, she said she is aware that RMR had said she didn't need any further tcs's but she had an episode of severe abd pain last week that felt like someone was stabbing her in the abd, just above her navel and it happened about 7 times. She said she would rate the pain as a 6. Pt is very concerned and feels like she may need another tcs. I advised her that she would need an ov and she said it was ok to schedule and call her at [phone]. I have the pt scheduled for 6/23 at 10:30 with AB. Tried to call pt- NA- LMOM to return call.

## 2020-03-29 NOTE — Telephone Encounter (Signed)
Communication noted.  

## 2020-03-29 NOTE — Telephone Encounter (Signed)
I contacted the pt, she said after she got home last night and was thinking about her ov with her GYN yesterday and they told her she had a hernia and was going to refer her for a hernia repair and she feels like the pain is coming from the hernia.  She doesn't want an appointment with [place] right now and will call back if she needs anything. I have cancelled her appt with [place] and reminded her that if she needs anything, she should call us. Pt stated she would.   Routing to RMR for FYI.

## 2020-03-30 ENCOUNTER — Ambulatory Visit: Payer: Medicare Other | Admitting: Gastroenterology

## 2020-12-14 ENCOUNTER — Other Ambulatory Visit: Payer: Self-pay | Admitting: Emergency Medicine

## 2020-12-14 ENCOUNTER — Other Ambulatory Visit: Payer: Self-pay | Admitting: Internal Medicine

## 2020-12-14 DIAGNOSIS — Z0000 Encounter for general adult medical examination without abnormal findings: Secondary | ICD-10-CM

## 2021-01-06 ENCOUNTER — Other Ambulatory Visit: Payer: Self-pay

## 2021-01-06 ENCOUNTER — Ambulatory Visit
Admission: RE | Admit: 2021-01-06 | Discharge: 2021-01-06 | Disposition: A | Discharge status: Home / Self Care | Payer: Medicare Other | Source: Ambulatory Visit | Attending: Internal Medicine | Admitting: Internal Medicine

## 2021-01-06 DIAGNOSIS — Z0000 Encounter for general adult medical examination without abnormal findings: Secondary | ICD-10-CM

## 2021-11-08 ENCOUNTER — Other Ambulatory Visit: Payer: Self-pay | Admitting: Orthopaedic Surgery

## 2021-11-09 ENCOUNTER — Other Ambulatory Visit: Payer: Self-pay

## 2021-11-09 ENCOUNTER — Encounter (HOSPITAL_BASED_OUTPATIENT_CLINIC_OR_DEPARTMENT_OTHER): Payer: Self-pay | Admitting: Orthopaedic Surgery

## 2021-11-20 NOTE — Anesthesia Preprocedure Evaluation (Addendum)
Anesthesia Evaluation  Patient identified by MRN, date of birth, ID band Patient awake    Reviewed: Allergy & Precautions, H&P , NPO status , Patient's Chart, lab work & pertinent test results  Airway Mallampati: I  TM Distance: >3 FB Neck ROM: Full    Dental no notable dental hx. (+) Edentulous Upper, Poor Dentition, Missing, Partial Lower,    Pulmonary neg pulmonary ROS,    Pulmonary exam normal breath sounds clear to auscultation       Cardiovascular Exercise Tolerance: Good negative cardio ROS Normal cardiovascular exam Rhythm:Regular Rate:Normal     Neuro/Psych negative neurological ROS  negative psych ROS   GI/Hepatic negative GI ROS, Neg liver ROS,   Endo/Other  negative endocrine ROS  Renal/GU negative Renal ROS  negative genitourinary   Musculoskeletal negative musculoskeletal ROS (+)   Abdominal   Peds negative pediatric ROS (+)  Hematology negative hematology ROS (+)   Anesthesia Other Findings   Reproductive/Obstetrics negative OB ROS                           Anesthesia Physical Anesthesia Plan  ASA: 2  Anesthesia Plan: General   Post-op Pain Management: Regional block* and Ofirmev IV (intra-op)*   Induction:   PONV Risk Score and Plan: 3 and Ondansetron, Dexamethasone and Treatment may vary due to age or medical condition  Airway Management Planned: Oral ETT and LMA  Additional Equipment: None  Intra-op Plan:   Post-operative Plan: Extubation in OR  Informed Consent: I have reviewed the patients History and Physical, chart, labs and discussed the procedure including the risks, benefits and alternatives for the proposed anesthesia with the patient or authorized representative who has indicated his/her understanding and acceptance.     Dental advisory given  Plan Discussed with: Anesthesiologist and CRNA  Anesthesia Plan Comments: (Discussed both nerve block  for pain relief post-op and GA; including NV, sore throat, dental injury, and pulmonary complications)        Anesthesia Quick Evaluation

## 2021-11-21 ENCOUNTER — Ambulatory Visit (HOSPITAL_BASED_OUTPATIENT_CLINIC_OR_DEPARTMENT_OTHER): Payer: Medicare HMO

## 2021-11-21 ENCOUNTER — Encounter (HOSPITAL_BASED_OUTPATIENT_CLINIC_OR_DEPARTMENT_OTHER)
Admission: RE | Disposition: A | Discharge status: Home / Self Care | Payer: Self-pay | Source: Home / Self Care | Attending: Orthopaedic Surgery

## 2021-11-21 ENCOUNTER — Ambulatory Visit (HOSPITAL_BASED_OUTPATIENT_CLINIC_OR_DEPARTMENT_OTHER): Payer: Medicare HMO | Admitting: Anesthesiology

## 2021-11-21 ENCOUNTER — Other Ambulatory Visit: Payer: Self-pay

## 2021-11-21 ENCOUNTER — Ambulatory Visit (HOSPITAL_BASED_OUTPATIENT_CLINIC_OR_DEPARTMENT_OTHER)
Admission: RE | Admit: 2021-11-21 | Discharge: 2021-11-21 | Disposition: A | Discharge status: Home / Self Care | Payer: Medicare HMO | Attending: Orthopaedic Surgery | Admitting: Orthopaedic Surgery

## 2021-11-21 ENCOUNTER — Encounter (HOSPITAL_BASED_OUTPATIENT_CLINIC_OR_DEPARTMENT_OTHER): Payer: Self-pay | Admitting: Orthopaedic Surgery

## 2021-11-21 DIAGNOSIS — M79671 Pain in right foot: Secondary | ICD-10-CM | POA: Insufficient documentation

## 2021-11-21 DIAGNOSIS — M19071 Primary osteoarthritis, right ankle and foot: Secondary | ICD-10-CM

## 2021-11-21 DIAGNOSIS — M21611 Bunion of right foot: Secondary | ICD-10-CM

## 2021-11-21 HISTORY — PX: ARTHRODESIS METATARSALPHALANGEAL JOINT (MTPJ): SHX6566

## 2021-11-21 SURGERY — FUSION, JOINT, GREAT TOE
Anesthesia: General | Site: Toe | Laterality: Right

## 2021-11-21 MED ORDER — OXYCODONE HCL 5 MG PO TABS: 5.0000 mg | ORAL_TABLET | Freq: Once | ORAL | Status: DC | PRN

## 2021-11-21 MED ORDER — FENTANYL CITRATE (PF) 100 MCG/2ML IJ SOLN
INTRAMUSCULAR | Status: DC | PRN
  Administered 2021-11-21: 25 ug via INTRAVENOUS

## 2021-11-21 MED ORDER — SUCCINYLCHOLINE CHLORIDE 200 MG/10ML IV SOSY
PREFILLED_SYRINGE | INTRAVENOUS | Status: AC
  Filled 2021-11-21: qty 10

## 2021-11-21 MED ORDER — ACETAMINOPHEN 325 MG PO TABS: 325.0000 mg | ORAL_TABLET | ORAL | Status: DC | PRN

## 2021-11-21 MED ORDER — MEPERIDINE HCL 25 MG/ML IJ SOLN: 6.2500 mg | INTRAMUSCULAR | Status: DC | PRN

## 2021-11-21 MED ORDER — OXYCODONE HCL 5 MG PO TABS: 5.0000 mg | ORAL_TABLET | ORAL | 0 refills | Status: AC | PRN

## 2021-11-21 MED ORDER — BUPIVACAINE LIPOSOME 1.3 % IJ SUSP
INTRAMUSCULAR | Status: DC | PRN
  Administered 2021-11-21: 10 mL via PERINEURAL

## 2021-11-21 MED ORDER — FENTANYL CITRATE (PF) 100 MCG/2ML IJ SOLN
INTRAMUSCULAR | Status: AC
  Filled 2021-11-21: qty 2

## 2021-11-21 MED ORDER — MIDAZOLAM HCL 2 MG/2ML IJ SOLN
INTRAMUSCULAR | Status: AC
  Filled 2021-11-21: qty 2

## 2021-11-21 MED ORDER — ONDANSETRON HCL 4 MG/2ML IJ SOLN
INTRAMUSCULAR | Status: AC
  Filled 2021-11-21: qty 2

## 2021-11-21 MED ORDER — PHENYLEPHRINE HCL (PRESSORS) 10 MG/ML IV SOLN
INTRAVENOUS | Status: DC | PRN
  Administered 2021-11-21: 40 ug via INTRAVENOUS
  Administered 2021-11-21: 120 ug via INTRAVENOUS

## 2021-11-21 MED ORDER — PHENYLEPHRINE 40 MCG/ML (10ML) SYRINGE FOR IV PUSH (FOR BLOOD PRESSURE SUPPORT)
PREFILLED_SYRINGE | INTRAVENOUS | Status: AC
  Filled 2021-11-21: qty 10

## 2021-11-21 MED ORDER — CEFAZOLIN SODIUM-DEXTROSE 2-4 GM/100ML-% IV SOLN
2.0000 g | INTRAVENOUS | Status: AC
  Administered 2021-11-21: 2 g via INTRAVENOUS

## 2021-11-21 MED ORDER — OXYCODONE HCL 5 MG/5ML PO SOLN: 5.0000 mg | Freq: Once | ORAL | Status: DC | PRN

## 2021-11-21 MED ORDER — EPHEDRINE 5 MG/ML INJ
INTRAVENOUS | Status: AC
  Filled 2021-11-21: qty 5

## 2021-11-21 MED ORDER — DEXAMETHASONE SODIUM PHOSPHATE 10 MG/ML IJ SOLN
INTRAMUSCULAR | Status: DC | PRN
  Administered 2021-11-21: 5 mg via INTRAVENOUS

## 2021-11-21 MED ORDER — FENTANYL CITRATE (PF) 100 MCG/2ML IJ SOLN
100.0000 ug | Freq: Once | INTRAMUSCULAR | Status: AC
  Administered 2021-11-21: 50 ug via INTRAVENOUS

## 2021-11-21 MED ORDER — 0.9 % SODIUM CHLORIDE (POUR BTL) OPTIME
TOPICAL | Status: DC | PRN
  Administered 2021-11-21: 150 mL

## 2021-11-21 MED ORDER — EPHEDRINE SULFATE (PRESSORS) 50 MG/ML IJ SOLN
INTRAMUSCULAR | Status: DC | PRN
  Administered 2021-11-21 (×2): 10 mg via INTRAVENOUS

## 2021-11-21 MED ORDER — ACETAMINOPHEN 160 MG/5ML PO SOLN: 325.0000 mg | ORAL | Status: DC | PRN

## 2021-11-21 MED ORDER — PROPOFOL 10 MG/ML IV BOLUS
INTRAVENOUS | Status: DC | PRN
  Administered 2021-11-21: 140 mg via INTRAVENOUS

## 2021-11-21 MED ORDER — FENTANYL CITRATE (PF) 100 MCG/2ML IJ SOLN: 25.0000 ug | INTRAMUSCULAR | Status: DC | PRN

## 2021-11-21 MED ORDER — DEXAMETHASONE SODIUM PHOSPHATE 10 MG/ML IJ SOLN
INTRAMUSCULAR | Status: AC
  Filled 2021-11-21: qty 1

## 2021-11-21 MED ORDER — MIDAZOLAM HCL 2 MG/2ML IJ SOLN
2.0000 mg | Freq: Once | INTRAMUSCULAR | Status: AC
  Administered 2021-11-21: 1 mg via INTRAVENOUS

## 2021-11-21 MED ORDER — LACTATED RINGERS IV SOLN: INTRAVENOUS | Status: DC

## 2021-11-21 MED ORDER — BUPIVACAINE-EPINEPHRINE (PF) 0.5% -1:200000 IJ SOLN
INTRAMUSCULAR | Status: DC | PRN
  Administered 2021-11-21: 15 mL via PERINEURAL

## 2021-11-21 MED ORDER — CEFAZOLIN SODIUM-DEXTROSE 2-4 GM/100ML-% IV SOLN
INTRAVENOUS | Status: AC
  Filled 2021-11-21: qty 100

## 2021-11-21 MED ORDER — LIDOCAINE HCL (CARDIAC) PF 100 MG/5ML IV SOSY
PREFILLED_SYRINGE | INTRAVENOUS | Status: DC | PRN
  Administered 2021-11-21: 80 mg via INTRAVENOUS

## 2021-11-21 MED ORDER — ONDANSETRON HCL 4 MG/2ML IJ SOLN: 4.0000 mg | Freq: Once | INTRAMUSCULAR | Status: DC | PRN

## 2021-11-21 MED ORDER — LIDOCAINE 2% (20 MG/ML) 5 ML SYRINGE
INTRAMUSCULAR | Status: AC
  Filled 2021-11-21: qty 5

## 2021-11-21 MED ORDER — ATROPINE SULFATE 0.4 MG/ML IV SOLN
INTRAVENOUS | Status: AC
  Filled 2021-11-21: qty 1

## 2021-11-21 SURGICAL SUPPLY — 75 items
APL PRP STRL LF DISP 70% ISPRP (MISCELLANEOUS) ×1
BIT DRILL 2 CANN SM BONE QF (BIT) ×1 IMPLANT
BIT DRILL CANN F/COMP 2.2 (BIT) ×1 IMPLANT
BLADE LONG MED 25X9 (BLADE) IMPLANT
BLADE OSC/SAG .038X5.5 CUT EDG (BLADE) IMPLANT
BLADE SURG 15 STRL LF DISP TIS (BLADE) ×2 IMPLANT
BLADE SURG 15 STRL SS (BLADE) ×4
BNDG CMPR 9X4 STRL LF SNTH (GAUZE/BANDAGES/DRESSINGS) ×1
BNDG COHESIVE 4X5 TAN ST LF (GAUZE/BANDAGES/DRESSINGS) IMPLANT
BNDG CONFORM 2 STRL LF (GAUZE/BANDAGES/DRESSINGS) ×2 IMPLANT
BNDG ELASTIC 4X5.8 VLCR STR LF (GAUZE/BANDAGES/DRESSINGS) ×2 IMPLANT
BNDG ELASTIC 6X5.8 VLCR STR LF (GAUZE/BANDAGES/DRESSINGS) IMPLANT
BNDG ESMARK 4X9 LF (GAUZE/BANDAGES/DRESSINGS) ×2 IMPLANT
CHLORAPREP W/TINT 26 (MISCELLANEOUS) ×2 IMPLANT
COVER BACK TABLE 60X90IN (DRAPES) ×2 IMPLANT
DRAPE EXTREMITY T 121X128X90 (DISPOSABLE) ×2 IMPLANT
DRAPE IMP U-DRAPE 54X76 (DRAPES) ×2 IMPLANT
DRAPE OEC MINIVIEW 54X84 (DRAPES) ×2 IMPLANT
DRAPE U-SHAPE 47X51 STRL (DRAPES) ×1 IMPLANT
ELECT REM PT RETURN 9FT ADLT (ELECTROSURGICAL) ×2
ELECTRODE REM PT RTRN 9FT ADLT (ELECTROSURGICAL) ×1 IMPLANT
GAUZE SPONGE 4X4 12PLY STRL (GAUZE/BANDAGES/DRESSINGS) ×2 IMPLANT
GAUZE XEROFORM 1X8 LF (GAUZE/BANDAGES/DRESSINGS) ×2 IMPLANT
GLOVE SRG 8 PF TXTR STRL LF DI (GLOVE) ×1 IMPLANT
GLOVE SURG ENC TEXT LTX SZ7.5 (GLOVE) ×2 IMPLANT
GLOVE SURG POLYISO LF SZ6.5 (GLOVE) ×1 IMPLANT
GLOVE SURG UNDER POLY LF SZ6.5 (GLOVE) ×1 IMPLANT
GLOVE SURG UNDER POLY LF SZ7 (GLOVE) ×1 IMPLANT
GLOVE SURG UNDER POLY LF SZ8 (GLOVE) ×2
GOWN STRL REUS W/ TWL LRG LVL3 (GOWN DISPOSABLE) ×1 IMPLANT
GOWN STRL REUS W/ TWL XL LVL3 (GOWN DISPOSABLE) ×1 IMPLANT
GOWN STRL REUS W/TWL LRG LVL3 (GOWN DISPOSABLE) ×2
GOWN STRL REUS W/TWL XL LVL3 (GOWN DISPOSABLE) ×4
GUIDEWIRE .045IN 1.14MM (WIRE) ×2 IMPLANT
GUIDEWIRE 1.6 (WIRE) ×2
GUIDEWIRE ORTH 157X1.6XTROC (WIRE) IMPLANT
NEEDLE HYPO 22GX1.5 SAFETY (NEEDLE) ×1 IMPLANT
NS IRRIG 1000ML POUR BTL (IV SOLUTION) ×2 IMPLANT
PACK BASIN DAY SURGERY FS (CUSTOM PROCEDURE TRAY) ×2 IMPLANT
PAD CAST 4YDX4 CTTN HI CHSV (CAST SUPPLIES) ×1 IMPLANT
PADDING CAST COTTON 4X4 STRL (CAST SUPPLIES) ×2
PADDING CAST SYNTHETIC 4 (CAST SUPPLIES) ×2
PADDING CAST SYNTHETIC 4X4 STR (CAST SUPPLIES) ×2 IMPLANT
PENCIL SMOKE EVACUATOR (MISCELLANEOUS) ×2 IMPLANT
PIN BB TAK MTP SU (PIN) ×1 IMPLANT
PLATE MTP STD 4H (Plate) ×1 IMPLANT
REAMER METATARSAL CUP 20MM (MISCELLANEOUS) ×1 IMPLANT
REAMER PHALANGEAL CONE 20MM (MISCELLANEOUS) ×1 IMPLANT
SCREW CANN.3.0X34 (Screw) ×1 IMPLANT
SCREW CORTICAL 3.0X18 (Screw) ×1 IMPLANT
SCREW LOCK 18X3XVALOPRFL (Screw) IMPLANT
SCREW LOCK TI QF 3X14 (Screw) ×1 IMPLANT
SCREW LOCK TI QF 3X20 (Screw) ×1 IMPLANT
SCREW LOCKING 3.0X18 (Screw) ×4 IMPLANT
SHEET MEDIUM DRAPE 40X70 STRL (DRAPES) ×2 IMPLANT
SLEEVE SCD COMPRESS KNEE MED (STOCKING) ×2 IMPLANT
SPIKE FLUID TRANSFER (MISCELLANEOUS) IMPLANT
SPONGE T-LAP 18X18 ~~LOC~~+RFID (SPONGE) ×1 IMPLANT
STOCKINETTE 6  STRL (DRAPES) ×2
STOCKINETTE 6 STRL (DRAPES) ×1 IMPLANT
SUCTION FRAZIER HANDLE 10FR (MISCELLANEOUS) ×2
SUCTION TUBE FRAZIER 10FR DISP (MISCELLANEOUS) ×1 IMPLANT
SUT ETHILON 3 0 PS 1 (SUTURE) ×2 IMPLANT
SUT FIBERWIRE 2-0 18 17.9 3/8 (SUTURE)
SUT MNCRL AB 3-0 PS2 18 (SUTURE) ×3 IMPLANT
SUT PDS AB 2-0 CT2 27 (SUTURE) ×2 IMPLANT
SUT VIC AB 2-0 SH 27 (SUTURE) ×2
SUT VIC AB 2-0 SH 27XBRD (SUTURE) IMPLANT
SUT VIC AB 3-0 FS2 27 (SUTURE) IMPLANT
SUTURE FIBERWR 2-0 18 17.9 3/8 (SUTURE) IMPLANT
SYR BULB EAR ULCER 3OZ GRN STR (SYRINGE) ×2 IMPLANT
SYR CONTROL 10ML LL (SYRINGE) IMPLANT
TOWEL GREEN STERILE FF (TOWEL DISPOSABLE) ×4 IMPLANT
TUBE CONNECTING 20X1/4 (TUBING) ×2 IMPLANT
UNDERPAD 30X36 HEAVY ABSORB (UNDERPADS AND DIAPERS) ×2 IMPLANT

## 2021-11-21 NOTE — Anesthesia Postprocedure Evaluation (Signed)
Anesthesia Post Note  Patient: Margaret Murray  Procedure(s) Performed: RIGHT FIRST METATARSOPHALANGEAL ARTHRODESIS (Right: Toe)     Patient location during evaluation: PACU Anesthesia Type: General Level of consciousness: awake and alert Pain management: pain level controlled Vital Signs Assessment: post-procedure vital signs reviewed and stable Respiratory status: spontaneous breathing, nonlabored ventilation, respiratory function stable and patient connected to nasal cannula oxygen Cardiovascular status: blood pressure returned to baseline and stable Postop Assessment: no apparent nausea or vomiting Anesthetic complications: no   No notable events documented.  Last Vitals:  Vitals:   11/21/21 0720 11/21/21 0851  BP: 115/75 111/64  Pulse: 69 72  Resp: (!) 21 12  Temp:  (!) 36.3 C  SpO2: 100% 98%    Last Pain:  Vitals:   11/21/21 0641  TempSrc: Oral  PainSc: 0-No pain                 Taishaun Levels

## 2021-11-21 NOTE — H&P (Signed)
PREOPERATIVE H&P  Chief Complaint: Right foot pain  HPI: Margaret Murray is a 76 y.o. female who presents for preoperative history and physical with a diagnosis of right first MTP arthritis. Symptoms are rated as moderate to severe, and have been worsening.  This is significantly impairing activities of daily living.  She has elected for surgical management.   History reviewed. No pertinent past medical history. Past Surgical History:  Procedure Laterality Date   ABDOMINAL HYSTERECTOMY     BREAST BIOPSY     BREAST EXCISIONAL BIOPSY Right    COLONOSCOPY N/A 01/23/2017   Procedure: COLONOSCOPY;  Surgeon: Corbin Ade, MD;  Location: [facility];  Service: Endoscopy;  Laterality: N/A;  8:30   Social History   Socioeconomic History   Marital status: Single    Spouse name: Not on file   Number of children: Not on file   Years of education: Not on file   Highest education level: Not on file  Occupational History   Not on file  Tobacco Use   Smoking status: Never   Smokeless tobacco: Never  Substance and Sexual Activity   Alcohol use: No   Drug use: No   Sexual activity: Not on file  Other Topics Concern   Not on file  Social History Narrative   Patient is [employer] 49 [employer] who has followed a careful diet of low sugar and plant based diet (no animal meats) since age 28   Social Determinants of Health   Financial Resource Strain: Not on file  Food Insecurity: Not on file  Transportation Needs: Not on file  Physical Activity: Not on file  Stress: Not on file  Social Connections: Not on file   Family History  Problem Relation Age of Onset   Diabetes Mother    Dementia Mother    Pancreatic cancer Father    Hyperthyroidism Sister    Hypertension Sister    Crohn's disease Brother    Multiple sclerosis Sister    Breast cancer Neg Hx    No Known Allergies Prior to Admission medications   Medication Sig Start Date End Date Taking? Authorizing Provider  Echinacea 450  MG CAPS Take 450 mg by mouth daily.   Yes [provider]  Hyaluronic Acid-Vitamin C (HYALURONIC ACID PO) Take 2 capsules by mouth daily.   Yes [provider]  Lutein 20 MG TABS Take 20 mg by mouth daily.   Yes [provider]  magnesium citrate SOLN Take 1 Bottle by mouth once.   Yes [provider]  Probiotic Product (PROBIOTIC-10 PO) Take by mouth.   Yes [provider]  Cholecalciferol (VITAMIN D3 PO) Take 1 capsule by mouth daily.    [provider]  TURMERIC PO Take 600 mg by mouth daily.    [provider]     Positive ROS: All other systems have been reviewed and were otherwise negative with the exception of those mentioned in the HPI and as above.  Physical Exam:  Vitals:   11/21/21 0641  BP: 132/84  Pulse: 72  Resp: 14  Temp: (!) 97.5 F (36.4 C)  SpO2: 98%   General: Alert, no acute distress Cardiovascular: No pedal edema Respiratory: No cyanosis, no use of accessory musculature GI: No organomegaly, abdomen is soft and non-tender Skin: No lesions in the area of chief complaint Neurologic: Sensation intact distally Psychiatric: Patient is competent for consent with normal mood and affect Lymphatic: No axillary or cervical lymphadenopathy  MUSCULOSKELETAL: Right  foot with pain to palpation about the first MTP joint.  Stiffness with attempted motion.  Pain with motion.  Sensation grossly intact.  Foot is warm and well-perfused  Assessment: Right first MTP arthritis   Plan: Plan for right first MTP arthrodesis.  We discussed the risks, benefits and alternatives of surgery which include but are not limited to wound healing complications, infection, nonunion, malunion, need for further surgery, damage to surrounding structures and continued pain.  They understand there is no guarantees to an acceptable outcome.  After weighing these risks they opted to proceed with surgery.     Erle Crocker,  MD    11/21/2021 7:18 AM

## 2021-11-21 NOTE — Progress Notes (Signed)
Assisted Dr. Oddono with right, ultrasound guided, popliteal block. Side rails up, monitors on throughout procedure. See vital signs in flow sheet. Tolerated Procedure well. 

## 2021-11-21 NOTE — Discharge Instructions (Addendum)
DR. Susa Simmonds FOOT & ANKLE SURGERY POST-OP INSTRUCTIONS   Pain Management The numbing medicine and your leg will last around 18 hours, take a dose of your pain medicine as soon as you feel it wearing off to avoid rebound pain. Keep your foot elevated above heart level.  Make sure that your heel hangs free ('floats'). Take all prescribed medication as directed. If taking narcotic pain medication you may want to use an over-the-counter stool softener to avoid constipation. You may take over-the-counter NSAIDs (ibuprofen, naproxen, etc.) as well as over-the-counter acetaminophen as directed on the packaging as a supplement for your pain and may also use it to wean away from the prescription medication.  Activity Heel WB in post operative shoe or boot  Keep dressing in place  First Postoperative Visit Your first postop visit will be at least 2 weeks after surgery.  This should be scheduled when you schedule surgery. If you do not have a postoperative visit scheduled please call [phone] to schedule an appointment. At the appointment your incision will be evaluated for suture removal, x-rays will be obtained if necessary.  General Instructions Swelling is very common after foot and ankle surgery.  It often takes 3 months for the foot and ankle to begin to feel comfortable.  Some amount of swelling will persist for 6-12 months. DO NOT change the dressing.  If there is a problem with the dressing (too tight, loose, gets wet, etc.) please contact Dr. Donnie Mesa office. DO NOT get the dressing wet.  For showers you can use an over-the-counter cast cover or wrap a washcloth around the top of your dressing and then cover it with a plastic bag and tape it to your leg. DO NOT soak the incision (no tubs, pools, bath, etc.) until you have approval from Dr. Susa Simmonds.  Contact Dr. Garret Reddish office or go to Emergency Room if: Temperature above 101 F. Increasing pain that is unresponsive to pain medication or  elevation Excessive redness or swelling in your foot Dressing problems - excessive bloody drainage, looseness or tightness, or if dressing gets wet Develop pain, swelling, warmth, or discoloration of your calf    Information for Discharge Teaching: EXPAREL (bupivacaine liposome injectable suspension)   Your surgeon or anesthesiologist gave you EXPAREL(bupivacaine) to help control your pain after surgery.  EXPAREL is a local anesthetic that provides pain relief by numbing the tissue around the surgical site. EXPAREL is designed to release pain medication over time and can control pain for up to 72 hours. Depending on how you respond to EXPAREL, you may require less pain medication during your recovery.  Possible side effects: Temporary loss of sensation or ability to move in the area where bupivacaine was injected. Nausea, vomiting, constipation Rarely, numbness and tingling in your mouth or lips, lightheadedness, or anxiety may occur. Call your doctor right away if you think you may be experiencing any of these sensations, or if you have other questions regarding possible side effects.  Follow all other discharge instructions given to you by your surgeon or nurse. Eat a healthy diet and drink plenty of water or other fluids.  If you return to the hospital for any reason within 96 hours following the administration of EXPAREL, it is important for health care providers to know that you have received this anesthetic. A teal colored band has been placed on your arm with the date, time and amount of EXPAREL you have received in order to alert and inform your health care providers. Please leave  this armband in place for the full 96 hours following administration, and then you may remove the band.   Regional Anesthesia Blocks  1. Numbness or the inability to move the "blocked" extremity may last from 3-48 hours after placement. The length of time depends on the medication injected and your  individual response to the medication. If the numbness is not going away after 48 hours, call your surgeon.  2. The extremity that is blocked will need to be protected until the numbness is gone and the  Strength has returned. Because you cannot feel it, you will need to take extra care to avoid injury. Because it may be weak, you may have difficulty moving it or using it. You may not know what position it is in without looking at it while the block is in effect.  3. For blocks in the legs and feet, returning to weight bearing and walking needs to be done carefully. You will need to wait until the numbness is entirely gone and the strength has returned. You should be able to move your leg and foot normally before you try and bear weight or walk. You will need someone to be with you when you first try to ensure you do not fall and possibly risk injury.  4. Bruising and tenderness at the needle site are common side effects and will resolve in a few days.  5. Persistent numbness or new problems with movement should be communicated to the surgeon or the [facility] [phone] [facility] [phone]).    Post Anesthesia Home Care Instructions  Activity: Get plenty of rest for the remainder of the day. A responsible individual must stay with you for 24 hours following the procedure.  For the next 24 hours, DO NOT: -Drive a car -Advertising copywriter -Drink alcoholic beverages -Take any medication unless instructed by your physician -Make any legal decisions or sign important papers.  Meals: Start with liquid foods such as gelatin or soup. Progress to regular foods as tolerated. Avoid greasy, spicy, heavy foods. If nausea and/or vomiting occur, drink only clear liquids until the nausea and/or vomiting subsides. Call your physician if vomiting continues.  Special Instructions/Symptoms: Your throat may feel dry or sore from the anesthesia or the breathing tube placed in  your throat during surgery. If this causes discomfort, gargle with warm salt water. The discomfort should disappear within 24 hours.  If you had a scopolamine patch placed behind your ear for the management of post- operative nausea and/or vomiting:  1. The medication in the patch is effective for 72 hours, after which it should be removed.  Wrap patch in a tissue and discard in the trash. Wash hands thoroughly with soap and water. 2. You may remove the patch earlier than 72 hours if you experience unpleasant side effects which may include dry mouth, dizziness or visual disturbances. 3. Avoid touching the patch. Wash your hands with soap and water after contact with the patch.

## 2021-11-21 NOTE — Op Note (Signed)
Margaret Murray female 76 y.o. 11/21/2021  PreOperative Diagnosis: Right first MTP arthritis  PostOperative Diagnosis: Same  PROCEDURE: Right first MTP arthrodesis  SURGEON: Dub Mikes, MD  ASSISTANT: Jesse [place], PA-C: His assistance was necessary for prep and drape, exposure, holding retractors, provisional and internal fixation placement, wound closure and splinting.   ANESTHESIA: General LMA with peripheral nerve blockade  FINDINGS: Severe arthrosis with bony deformity of the first MTP joint  IMPLANTS: Arthrex first MTP arthrodesis plate  [id number]:76 y.o. female had severe arthrosis of her first MTP joint with bunion deformity.  Given the amount of arthritis she was indicated for first MTP arthrodesis.  She did fail conservative treatment form of shoe modifications, anti-inflammatories, topical anti-inflammatories and activity modifications.  She had continued pain and stiffness in the joint.   Patient understood the risks, benefits and alternatives to surgery which include but are not limited to wound healing complications, infection, nonunion, malunion, need for further surgery as well as damage to surrounding structures. They also understood the potential for continued pain in that there were no guarantees of acceptable outcome After weighing these risks the patient opted to proceed with surgery.  PROCEDURE: Patient was identified in the preoperative holding area.  The right foot was marked by myself.  Consent was signed by myself and the patient. Peripheral nerve block was performed by anesthesia.Patient was taken to the operative suite and placed supine on the operative table.  General LMA anesthesia was induced without difficulty. Bump was placed under the operative thigh bone foam was used.  Preoperative antibiotics were given.  Leg was prepped and draped in the usual sterile fashion.  Surgical timeout was performed.  A 4 inch Esmarch tourniquet was placed around  the right ankle.  I began by making a longitudinal incision overlying the first MTP joint.  This was done through skin and subcutaneous tissue.  Then blunt dissection was used to mobilize soft tissue.  Soft tissue was dissected bluntly using spreading technique with scissors.  The extensor hallucis longus tendon was identified and incision was made medial to this.  Care was taken to protect the tendon.  The tendon was then retracted.  The joint capsule overlying the first MTP joint was identified and incised in line with the joint.  The joint capsular tissue was mobilized dorsally about the joint as well as medially and laterally to mobilize the joint further.  The joint was inspected.  There is cartilage wear and evidence of bony erosions along the medial eminence. Rongeur was used to remove the bony exostosis on the dorsal medial aspect of the first MTP joint.  Once the joint was fully mobilized K wire was placed within the first metatarsal and the cup and cone reamer was used to remove remaining cartilage surfaces.   After denuding cartilage and exposed cancellus bone was obtained the toe was reduced under manual manipulation.   Then the first MTP joint was reduced under direct manipulation.  This was held provisionally with K wire.  Then fluoroscopy confirmed appropriate reduction of the first MTP joint and flat plate was used to confirm appropriate sagittal plane position of the first MTP joint.  A partially-threaded cannulated screw was placed from distal to proximal across the joint for lag fixation. Then a dorsal locking plate was placed across the first MTP joint with good compression and with a combination of locking and nonlocking screws.  The reduction was reassessed and found to be acceptable.  Final fluoroscopic images were obtained. Appropriate plate  placement and screw length was confirmed.  Tourniquet was released.  Hemostasis was obtained.  Wound was irrigated with normal saline.a small  amount of vancomycin powder was placed within the wound.  The wound was then closed in layered fashion using 3-0 Monocryl and 3-0 nylon suture including the dorsal capsular tissue.  Patient was placed in a soft dressing including Xeroform, 4 x 4's, sterile sheet cotton and a four-inch Ace wrap.   Patient was awakened from anesthesia and taken recovery in stable condition.  There were no complications.   POST OPERATIVE INSTRUCTIONS: Heel weightbearing to operative extremity.  Patient has a boot and will wear that. Follow-up in 2 weeks for wound check and nonweightbearing x-rays. Back in regular shoes likely at 6 weeks based on clinical improvement  TOURNIQUET TIME: 32 minutes  BLOOD LOSS:  Minimal         DRAINS: none         SPECIMEN: none       COMPLICATIONS:  * No complications entered in OR log *         Disposition: PACU - hemodynamically stable.         Condition: stable

## 2021-11-21 NOTE — Transfer of Care (Signed)
Immediate Anesthesia Transfer of Care Note  Patient: Margaret Murray  Procedure(s) Performed: RIGHT FIRST METATARSOPHALANGEAL ARTHRODESIS (Right: Toe)  Patient Location: PACU  Anesthesia Type:GA combined with regional for post-op pain  Level of Consciousness: sedated  Airway & Oxygen Therapy: Patient Spontanous Breathing and Patient connected to face mask oxygen  Post-op Assessment: Report given to RN and Post -op Vital signs reviewed and stable  Post vital signs: Reviewed and stable  Last Vitals:  Vitals Value Taken Time  BP 111/64 11/21/21 0851  Temp    Pulse 72 11/21/21 0852  Resp 12 11/21/21 0852  SpO2 98 % 11/21/21 0852  Vitals shown include unvalidated device data.  Last Pain:  Vitals:   11/21/21 0641  TempSrc: Oral  PainSc: 0-No pain      Patients Stated Pain Goal: 4 (11/21/21 0641)  Complications: No notable events documented.

## 2021-11-21 NOTE — Anesthesia Procedure Notes (Signed)
Procedure Name: LMA Insertion Date/Time: 11/21/2021 7:38 AM Performed by: Ronnette Hila, CRNA Pre-anesthesia Checklist: Patient identified, Emergency Drugs available, Suction available and Patient being monitored Patient Re-evaluated:Patient Re-evaluated prior to induction Oxygen Delivery Method: Circle system utilized Preoxygenation: Pre-oxygenation with 100% oxygen Induction Type: IV induction Ventilation: Mask ventilation without difficulty LMA: LMA inserted LMA Size: 3.0 Number of attempts: 1 Airway Equipment and Method: Bite block Placement Confirmation: positive ETCO2 Tube secured with: Tape Dental Injury: Teeth and Oropharynx as per pre-operative assessment

## 2021-11-22 ENCOUNTER — Encounter (HOSPITAL_BASED_OUTPATIENT_CLINIC_OR_DEPARTMENT_OTHER): Payer: Self-pay | Admitting: Orthopaedic Surgery

## 2022-02-05 ENCOUNTER — Other Ambulatory Visit: Payer: Self-pay | Admitting: Internal Medicine

## 2022-02-05 DIAGNOSIS — Z1231 Encounter for screening mammogram for malignant neoplasm of breast: Secondary | ICD-10-CM

## 2022-02-07 ENCOUNTER — Ambulatory Visit
Admission: RE | Admit: 2022-02-07 | Discharge: 2022-02-07 | Disposition: A | Discharge status: Home / Self Care | Payer: Medicare HMO | Source: Ambulatory Visit | Attending: Internal Medicine | Admitting: Internal Medicine

## 2022-02-07 DIAGNOSIS — Z1231 Encounter for screening mammogram for malignant neoplasm of breast: Secondary | ICD-10-CM

## 2022-04-30 DIAGNOSIS — Z800 Family history of malignant neoplasm of digestive organs: Secondary | ICD-10-CM | POA: Diagnosis not present

## 2022-04-30 DIAGNOSIS — Z9189 Other specified personal risk factors, not elsewhere classified: Secondary | ICD-10-CM | POA: Diagnosis not present

## 2022-04-30 DIAGNOSIS — Z1289 Encounter for screening for malignant neoplasm of other sites: Secondary | ICD-10-CM | POA: Diagnosis not present

## 2022-06-06 DIAGNOSIS — K2960 Other gastritis without bleeding: Secondary | ICD-10-CM | POA: Diagnosis not present

## 2022-06-06 DIAGNOSIS — K8689 Other specified diseases of pancreas: Secondary | ICD-10-CM | POA: Diagnosis not present

## 2022-06-06 DIAGNOSIS — Z8041 Family history of malignant neoplasm of ovary: Secondary | ICD-10-CM | POA: Diagnosis not present

## 2022-06-06 DIAGNOSIS — Z1289 Encounter for screening for malignant neoplasm of other sites: Secondary | ICD-10-CM | POA: Diagnosis not present

## 2022-06-06 DIAGNOSIS — K769 Liver disease, unspecified: Secondary | ICD-10-CM | POA: Diagnosis not present

## 2022-06-06 DIAGNOSIS — K7689 Other specified diseases of liver: Secondary | ICD-10-CM | POA: Diagnosis not present

## 2022-06-06 DIAGNOSIS — Z9189 Other specified personal risk factors, not elsewhere classified: Secondary | ICD-10-CM | POA: Diagnosis not present

## 2022-06-06 DIAGNOSIS — Z800 Family history of malignant neoplasm of digestive organs: Secondary | ICD-10-CM | POA: Diagnosis not present

## 2022-06-19 DIAGNOSIS — H2513 Age-related nuclear cataract, bilateral: Secondary | ICD-10-CM | POA: Diagnosis not present

## 2022-06-19 DIAGNOSIS — H43811 Vitreous degeneration, right eye: Secondary | ICD-10-CM | POA: Diagnosis not present

## 2022-06-19 IMAGING — MG MM DIGITAL SCREENING BILAT W/ TOMO AND CAD
6 of 10 series · 6 of 30 positions shown · non-contrast
Comparison: Previous exam(s).

CLINICAL DATA: Screening.

EXAM:
DIGITAL SCREENING BILATERAL MAMMOGRAM WITH TOMOSYNTHESIS AND CAD
TECHNIQUE: Bilateral screening digital craniocaudal and mediolateral oblique
mammograms were obtained. Bilateral screening digital breast
tomosynthesis was performed. The images were evaluated with
computer-aided detection.

[R MLO synth-2D (1 of 2)]
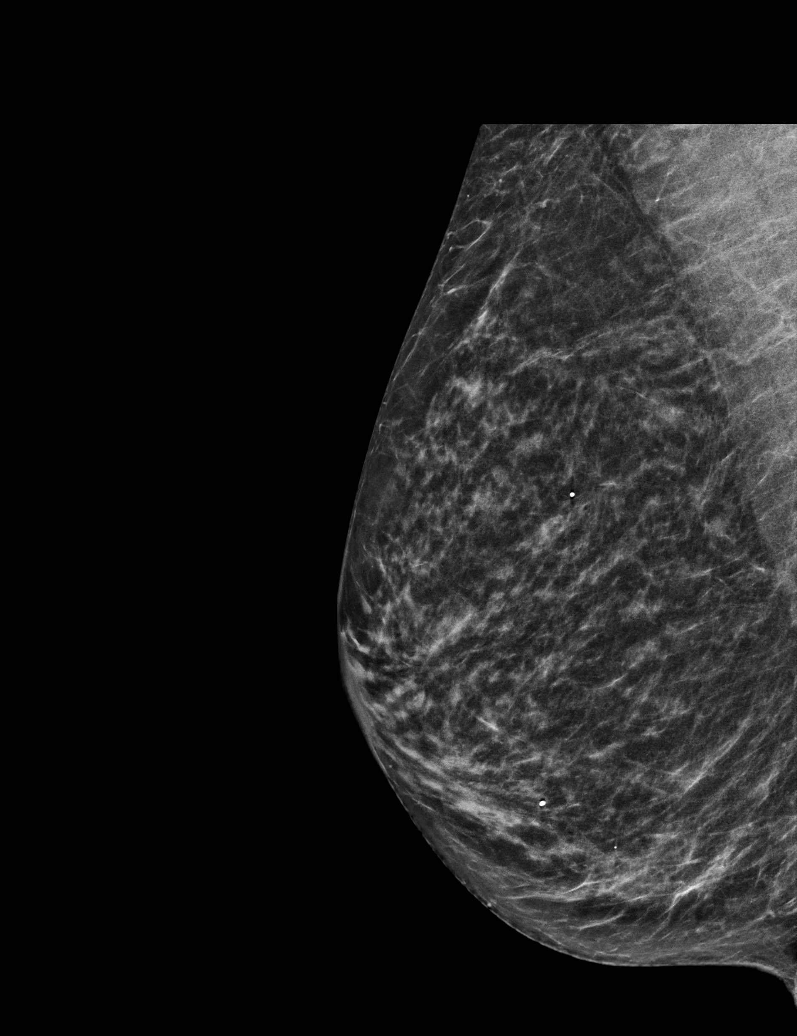

[L MLO synth-2D]
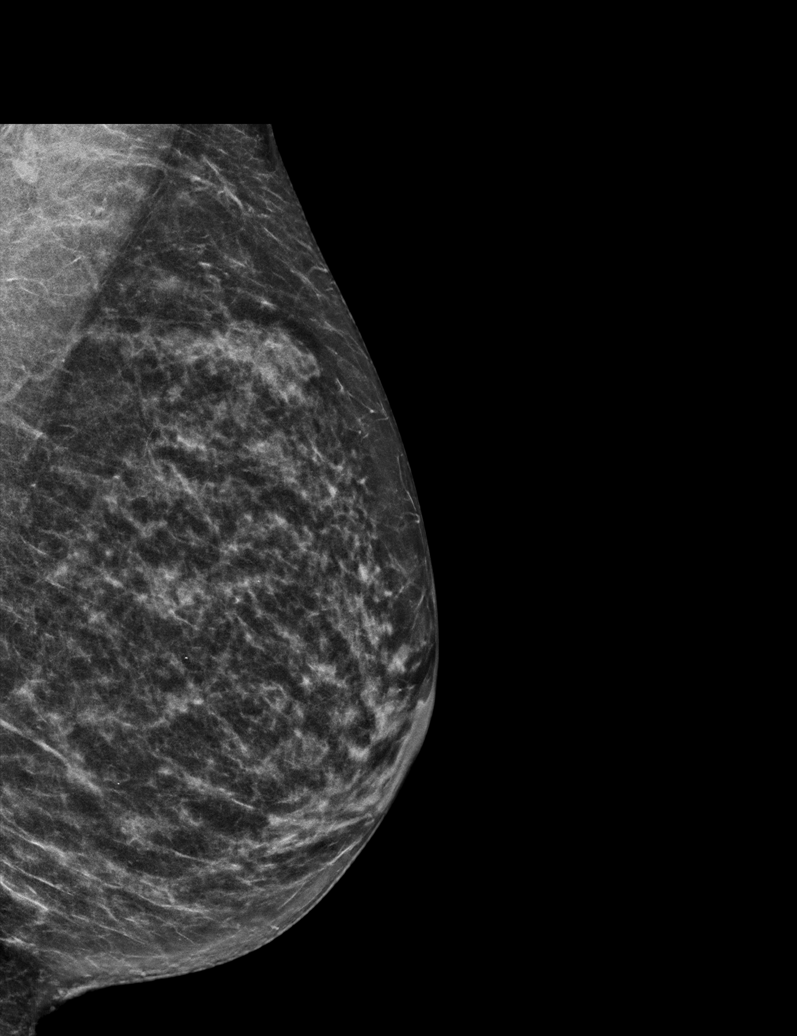

[R MLO synth-2D (2 of 2)]
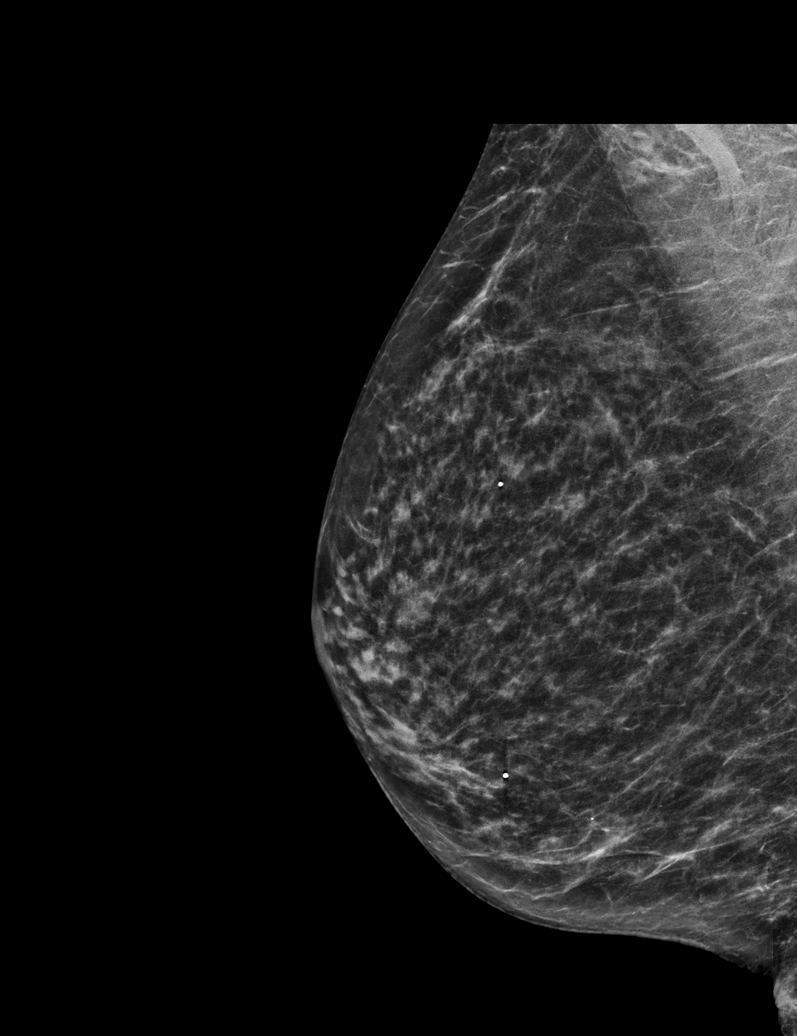

[R CC synth-2D]
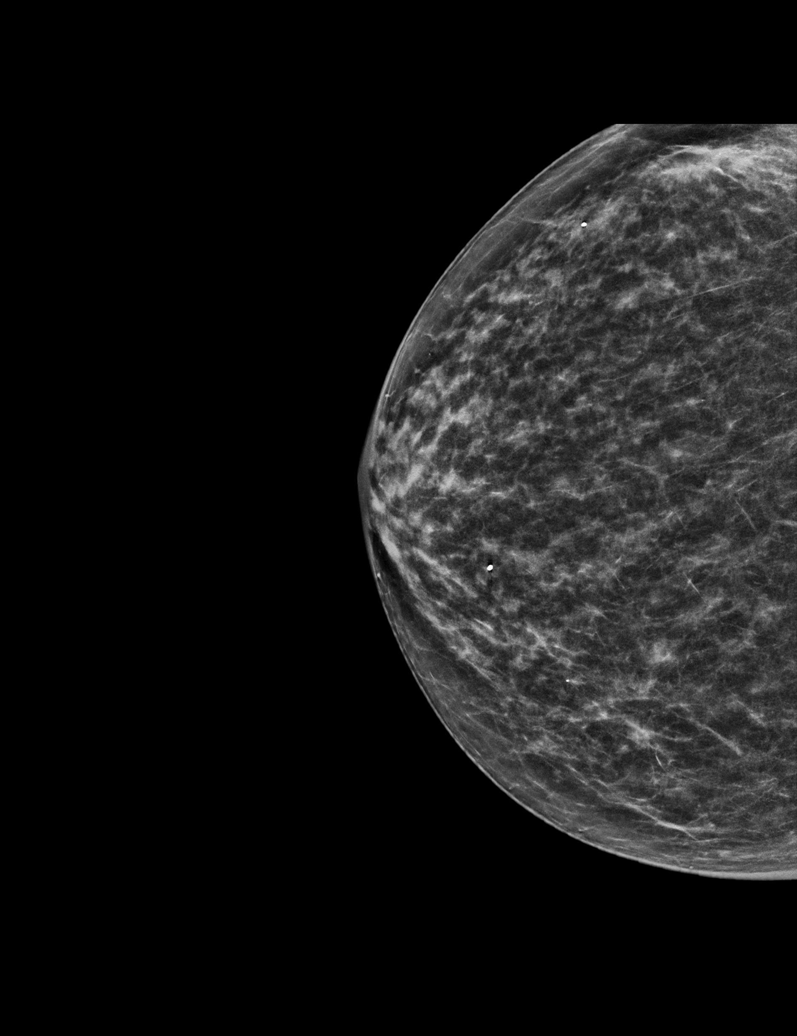

[L CC synth-2D]
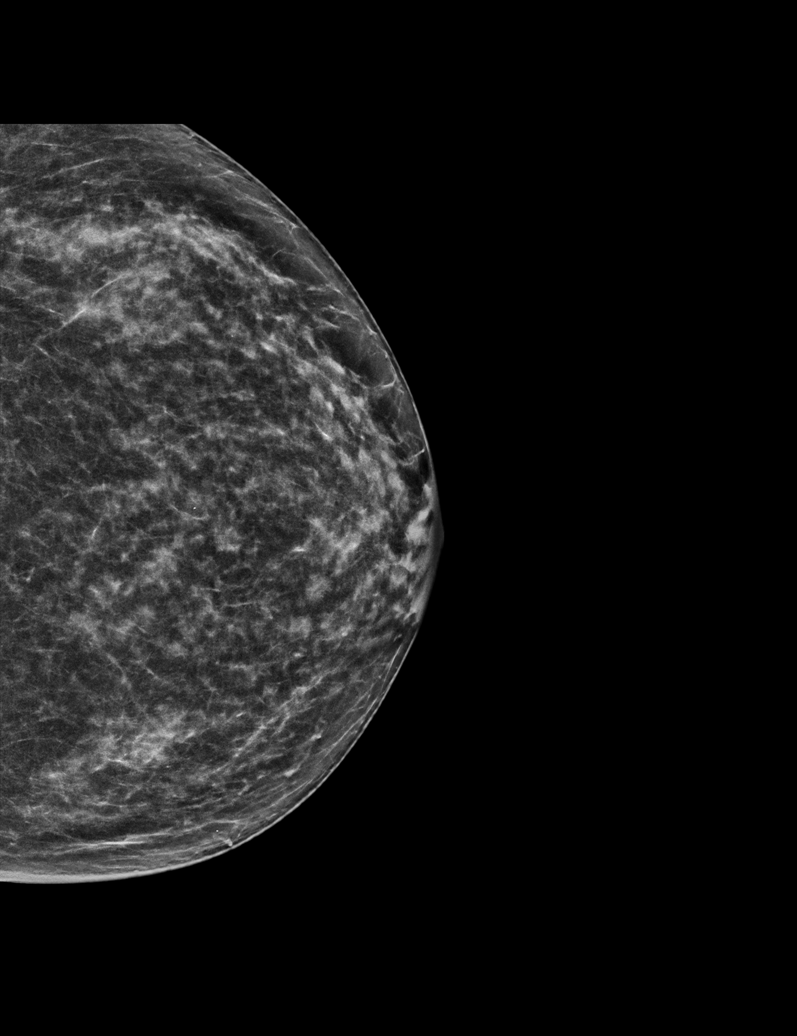

[L MLO tomo · tomo slice 27/54.0]
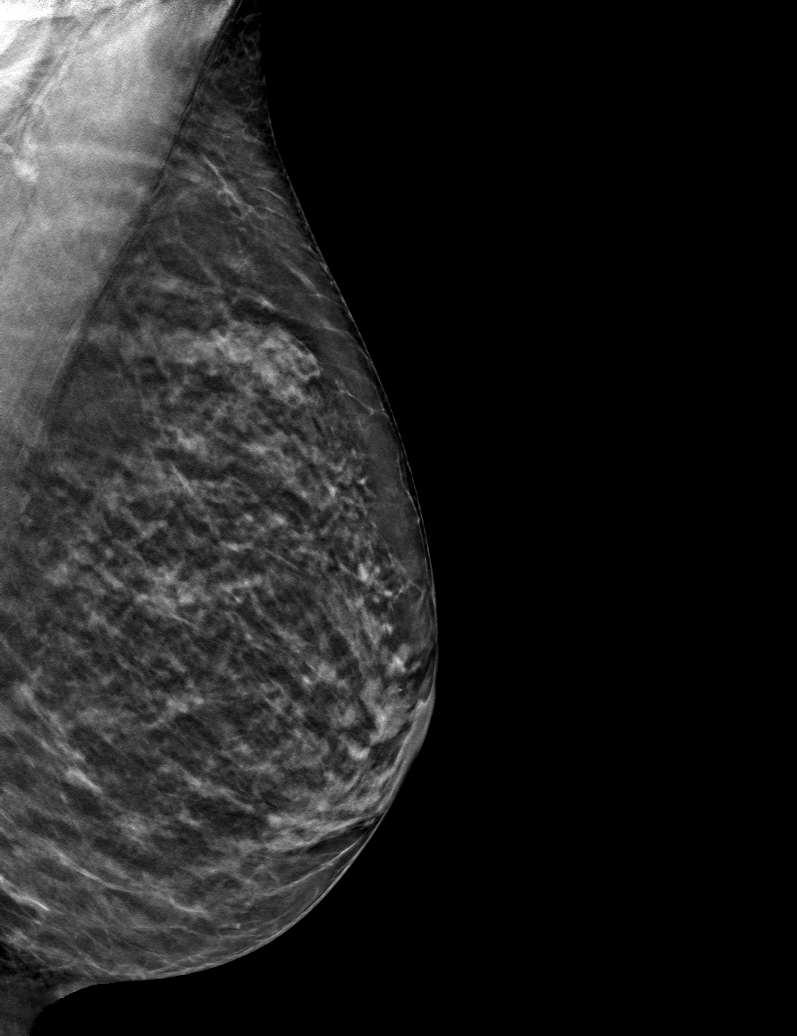

[6 of 30 positions shown; findings below may reference images not displayed]

ACR Breast Density Category c: The breast tissue is heterogeneously
dense, which may obscure small masses.
FINDINGS: There are no findings suspicious for malignancy.
IMPRESSION: No mammographic evidence of malignancy. A result letter of this
screening mammogram will be mailed directly to the patient.

RECOMMENDATION:
Screening mammogram in one year. (Code:Q3-W-BC3)

BI-RADS CATEGORY  1: Negative.

## 2022-07-25 DIAGNOSIS — B9681 Helicobacter pylori [H. pylori] as the cause of diseases classified elsewhere: Secondary | ICD-10-CM | POA: Diagnosis not present

## 2022-07-25 DIAGNOSIS — R7303 Prediabetes: Secondary | ICD-10-CM | POA: Diagnosis not present

## 2022-07-25 DIAGNOSIS — A048 Other specified bacterial intestinal infections: Secondary | ICD-10-CM | POA: Diagnosis not present

## 2022-07-25 DIAGNOSIS — Z0001 Encounter for general adult medical examination with abnormal findings: Secondary | ICD-10-CM | POA: Diagnosis not present

## 2022-07-25 DIAGNOSIS — Z0000 Encounter for general adult medical examination without abnormal findings: Secondary | ICD-10-CM | POA: Diagnosis not present

## 2022-07-25 DIAGNOSIS — Z13220 Encounter for screening for lipoid disorders: Secondary | ICD-10-CM | POA: Diagnosis not present

## 2022-11-21 DIAGNOSIS — Z01419 Encounter for gynecological examination (general) (routine) without abnormal findings: Secondary | ICD-10-CM | POA: Diagnosis not present

## 2022-11-21 DIAGNOSIS — Z90710 Acquired absence of both cervix and uterus: Secondary | ICD-10-CM | POA: Diagnosis not present

## 2022-12-13 DIAGNOSIS — L820 Inflamed seborrheic keratosis: Secondary | ICD-10-CM | POA: Diagnosis not present

## 2022-12-13 DIAGNOSIS — L821 Other seborrheic keratosis: Secondary | ICD-10-CM | POA: Diagnosis not present

## 2023-01-16 ENCOUNTER — Other Ambulatory Visit: Payer: Self-pay | Admitting: Internal Medicine

## 2023-01-16 DIAGNOSIS — Z1231 Encounter for screening mammogram for malignant neoplasm of breast: Secondary | ICD-10-CM

## 2023-01-17 DIAGNOSIS — Z9189 Other specified personal risk factors, not elsewhere classified: Secondary | ICD-10-CM | POA: Diagnosis not present

## 2023-01-17 DIAGNOSIS — K862 Cyst of pancreas: Secondary | ICD-10-CM | POA: Diagnosis not present

## 2023-01-23 DIAGNOSIS — Z1289 Encounter for screening for malignant neoplasm of other sites: Secondary | ICD-10-CM | POA: Diagnosis not present

## 2023-01-23 DIAGNOSIS — Z800 Family history of malignant neoplasm of digestive organs: Secondary | ICD-10-CM | POA: Diagnosis not present

## 2023-01-23 DIAGNOSIS — Z08 Encounter for follow-up examination after completed treatment for malignant neoplasm: Secondary | ICD-10-CM | POA: Diagnosis not present

## 2023-02-22 ENCOUNTER — Ambulatory Visit
Admission: RE | Admit: 2023-02-22 | Discharge: 2023-02-22 | Disposition: A | Discharge status: Home / Self Care | Payer: Medicare HMO | Source: Ambulatory Visit | Attending: Internal Medicine | Admitting: Internal Medicine

## 2023-02-22 DIAGNOSIS — Z1231 Encounter for screening mammogram for malignant neoplasm of breast: Secondary | ICD-10-CM

## 2023-04-22 DIAGNOSIS — M25561 Pain in right knee: Secondary | ICD-10-CM | POA: Diagnosis not present

## 2023-04-25 ENCOUNTER — Other Ambulatory Visit: Payer: Self-pay | Admitting: Obstetrics and Gynecology

## 2023-04-25 DIAGNOSIS — R102 Pelvic and perineal pain: Secondary | ICD-10-CM | POA: Diagnosis not present

## 2023-04-25 DIAGNOSIS — Z90710 Acquired absence of both cervix and uterus: Secondary | ICD-10-CM | POA: Diagnosis not present

## 2023-05-02 ENCOUNTER — Ambulatory Visit
Admission: RE | Admit: 2023-05-02 | Discharge: 2023-05-02 | Disposition: A | Discharge status: Home / Self Care | Payer: Medicare HMO | Source: Ambulatory Visit | Attending: Obstetrics and Gynecology | Admitting: Obstetrics and Gynecology

## 2023-05-02 DIAGNOSIS — R102 Pelvic and perineal pain: Secondary | ICD-10-CM

## 2023-05-23 ENCOUNTER — Other Ambulatory Visit: Payer: Self-pay

## 2023-05-23 ENCOUNTER — Ambulatory Visit (HOSPITAL_COMMUNITY): Payer: Medicare HMO | Attending: Orthopaedic Surgery

## 2023-05-23 DIAGNOSIS — M25561 Pain in right knee: Secondary | ICD-10-CM | POA: Insufficient documentation

## 2023-05-23 DIAGNOSIS — S76311S Strain of muscle, fascia and tendon of the posterior muscle group at thigh level, right thigh, sequela: Secondary | ICD-10-CM | POA: Insufficient documentation

## 2023-05-23 DIAGNOSIS — R262 Difficulty in walking, not elsewhere classified: Secondary | ICD-10-CM | POA: Diagnosis not present

## 2023-05-23 NOTE — Therapy (Addendum)
[facility] OUTPATIENT PHYSICAL THERAPY lower extremity EVALUATION   Patient Name: Margaret Murray MRN: [id number] DOB:1945-12-28, 77 y.o., female Today's Date: 05/23/2023  END OF SESSION:   PT End of Session - 05/23/23 1452     Visit Number 1    Number of Visits 12    Date for PT Re-Evaluation 07/04/23    Authorization Type [employer] MD HMO    Progress Note Due on Visit 10    Activity Tolerance Patient tolerated treatment well    Behavior During Therapy [facility] for tasks assessed/performed              No past medical history on file. Past Surgical History:  Procedure Laterality Date   ABDOMINAL HYSTERECTOMY     ARTHRODESIS METATARSALPHALANGEAL JOINT (MTPJ) Right 11/21/2021   Procedure: RIGHT FIRST METATARSOPHALANGEAL ARTHRODESIS;  Surgeon: Terance Hart, MD;  Location: [facility];  Service: Orthopedics;  Laterality: Right;  LENGHT OF SURGERY: 60 MINUTES   BREAST BIOPSY     BREAST EXCISIONAL BIOPSY Right    COLONOSCOPY N/A 01/23/2017   Procedure: COLONOSCOPY;  Surgeon: Corbin Ade, MD;  [facility];  Service: Endoscopy;  Laterality: N/A;  8:30   Patient Active Problem List   Diagnosis Date Noted   Right foot pain 11/21/2021    PCP: Fleet Contras, MDRef Provider (PCP)   REFERRING PROVIDER:   Marcene Corning, MD    REFERRING DIAG: HAMSTRING AND GASTROC STRAINS   Rationale for Evaluation and Treatment: Rehabilitation  THERAPY DIAG:  Difficulty in walking, not elsewhere classified - Plan: PT plan of care cert/re-cert  Acute pain of right knee - Plan: PT plan of care cert/re-cert  Strain of right hamstring, sequela - Plan: PT plan of care cert/re-cert  ONSET DATE: 04/22/23 --------------------------------------------------------------------------------------------- SUBJECTIVE:                                                                                                                                                                                            SUBJECTIVE STATEMENT: In July 2024, patient was running from her home to the car and felt a strong pull in her right lower extremity when arriving to the car. 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 Patient began to notice increased right HS pain.   PERTINENT HISTORY:  N/a  PAIN:  Are you having pain? Yes: NPRS scale: 3-4/10 Pain location: right lower extremity behind knee Pain description: dull/achy Aggravating factors: standing, walking, bending  Relieving factors: rest  PRECAUTIONS: None  RED FLAGS: None   WEIGHT BEARING RESTRICTIONS: No  FALLS:  Has patient fallen in last 6 months? 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   PLOF: Independent  PATIENT GOALS: to walk pain free  NEXT MD VISIT: 06/03/23 --------------------------------------------------------------------------------------------- OBJECTIVE:   DIAGNOSTIC FINDINGS:  N/A  PATIENT SURVEYS:  LEFS Lower Extremity Functional Score: 54 / 80 = 67.5 %  SCREENING FOR RED FLAGS: Bowel or bladder incontinence: No Spinal tumors: No Cauda equina syndrome: No Compression fracture: No Abdominal aneurysm: No  COGNITION: Overall cognitive status: Within functional limits for tasks assessed  POSTURE: increased lumbar lordosis and weight shift left      FUNCTIONAL TESTS:  5 times sit to stand: 22s; pt with increased pain; wt shift to the left lower extremity    GAIT ANALYSIS: Distance walked: 64ft Assistive device utilized: None Level of assistance: Complete Independence Comments: patient with antalgia on right lower extremity; wt shift to the left  SENSATION: Light touch: WFL   LOWER EXTREMITY MMT:    Bilateral lower extremity grossly 4-/5 except;  right knee flexion 3/5 with pain Right ankle PF 3+/5  with pain   LOWER EXTREMITY ROM:     Bilateral lower extremity ROM are WFL    PALPATION: Minimal tenderness to palpation right hamstrings --------------------------------------------------------------------------------------------- TODAY'S TREATMENT:                                                                                                                              DATE:   05/23/23  PT Low Complexity Eval  See above Therapeutic exercise x36min -Seated HS Curls with G theraband 10x5" hold Self-Care/ADLs  -Pain management  Activity/ position modification    PATIENT EDUCATION:  Education details: Patient educated on hamstring injury, pain management, HEP Person educated: Patient Education method: Medical illustrator Education comprehension: verbalized understanding  HOME EXERCISE PROGRAM: -Seated HS Curls with G theraband 10x5" --------------------------------------------------------------------------------------------- ASSESSMENT:  CLINICAL IMPRESSION: Patient is a 77 y.o. y.o. female who was seen today for physical therapy evaluation and treatment for right knee pain, difficulty walking due to right hamstring strain. Patient presents to PT with the following objective impairments: decreased activity tolerance, decreased endurance, difficulty walking, and decreased strength. These impairments limit the patient in activities such as carrying, lifting, bending, sitting, standing, squatting, stairs, and toileting. These impairments also limit the patient in participation such as meal prep, cleaning, laundry, driving, community activity, and yard work. The patient will benefit from PT to address the limitations/impairments listed below to return to their prior level of function in the domains of activity and participation.    PERSONAL FACTORS:  n/a  are also affecting patient's functional outcome.   REHAB POTENTIAL: Excellent  CLINICAL DECISION MAKING:  Stable/uncomplicated  EVALUATION COMPLEXITY: Low  --------------------------------------------------------------------------------------------- GOALS: Goals reviewed with patient? Yes  SHORT TERM GOALS: Target date: 06/13/2023      Patient will be able to complete  the Timed Up and Go Test (TUG) within 18 seconds to improve gait speed to facilitate safe ambulation around home and community  Baseline: 22s Goal status: INITIAL  2.  Patient will score a >/=  60 / 80 on the  LEFS   to demonstrate an improvement in ADL completion, stair negotiation, household/community ambulation, and self-care Baseline: 54 Goal status: INITIAL  3. Patient will be independent with a basic stretching/strengthening HEP  Baseline:  Goal status: INITIAL   LONG TERM GOALS: Target date: 07/04/2023     Patient will be able to complete the Timed Up and Go Test (TUG) within 12 seconds to improve gait speed to facilitate safe ambulation around home and community Baseline: 22s Goal status: INITIAL  2.  Patient will score a >/=  65 / 80 on the LEFS  on the  LEFS  to demonstrate an improvement in ADL completion, stair negotiation, household/community ambulation, and self-care Baseline: 54 Goal status: INITIAL  3.  Patient will be independent with a comprehensive strengthening HEP  Baseline:  Goal status: INITIAL  --------------------------------------------------------------------------------------------- PLAN:  PT FREQUENCY: 2x/week  PT DURATION: 6 weeks  PLANNED INTERVENTIONS: Therapeutic exercises, Therapeutic activity, Neuromuscular re-education, Balance training, Gait training, Patient/Family education, Self Care, and Joint mobilization.  PLAN FOR NEXT SESSION: Progress right hamstring strengthening/ ROM as tolerated    Seymour Bars, PT 05/23/2023, 3:02 PM

## 2023-05-28 ENCOUNTER — Ambulatory Visit (HOSPITAL_COMMUNITY): Payer: Medicare HMO

## 2023-05-28 DIAGNOSIS — R262 Difficulty in walking, not elsewhere classified: Secondary | ICD-10-CM

## 2023-05-28 DIAGNOSIS — M25561 Pain in right knee: Secondary | ICD-10-CM

## 2023-05-28 DIAGNOSIS — S76311S Strain of muscle, fascia and tendon of the posterior muscle group at thigh level, right thigh, sequela: Secondary | ICD-10-CM | POA: Diagnosis not present

## 2023-05-28 NOTE — Therapy (Signed)
[facility] OUTPATIENT PHYSICAL THERAPY lower extremity EVALUATION   Patient Name: Margaret Murray MRN: [id number] DOB:03-02-1946, 77 y.o., female Today's Date: 05/28/2023  END OF SESSION:      No past medical history on file. Past Surgical History:  Procedure Laterality Date   ABDOMINAL HYSTERECTOMY     ARTHRODESIS METATARSALPHALANGEAL JOINT (MTPJ) Right 11/21/2021   Procedure: RIGHT FIRST METATARSOPHALANGEAL ARTHRODESIS;  Surgeon: Terance Hart, MD;  Location: [facility];  Service: Orthopedics;  Laterality: Right;  LENGHT OF SURGERY: 60 MINUTES   BREAST BIOPSY     BREAST EXCISIONAL BIOPSY Right    COLONOSCOPY N/A 01/23/2017   Procedure: COLONOSCOPY;  Surgeon: Corbin Ade, MD;  [facility];  Service: Endoscopy;  Laterality: N/A;  8:30   Patient Active Problem List   Diagnosis Date Noted   Right foot pain 11/21/2021    PCP: Fleet Contras, MDRef Provider (PCP)   REFERRING PROVIDER:   Marcene Corning, MD    REFERRING DIAG: HAMSTRING AND GASTROC STRAINS   Rationale for Evaluation and Treatment: Rehabilitation  THERAPY DIAG:  No diagnosis found.  ONSET DATE: 04/22/23 ---------------------------------------------------------------------------------------------                                                                                                                                                                                      SUBJECTIVE STATEMENT:  Today: Patient states she attempted HEP with no increased pain. Patient with callous/pressure point pain on right metatarsal with certain shoes   ---------------------------------------------------------------------------------------------------------------------- Eval: In July 2024, patient was running from her home to the car and felt a strong pull in her right lower extremity when arriving to the car. 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 Patient  began to notice increased right HS pain.   PERTINENT HISTORY:  Right metatarsal fusion/ bunion surgery 2023  PAIN:  Are you having pain? Yes: NPRS scale: 3-4/10 Pain location: right lower extremity behind knee Pain description: dull/achy Aggravating factors: standing, walking, bending  Relieving factors: rest  PRECAUTIONS: None  RED FLAGS: None   WEIGHT BEARING RESTRICTIONS: No  FALLS:  Has patient fallen in last 6 months? 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   PLOF: Independent  PATIENT GOALS: to walk pain free  NEXT MD VISIT: 06/03/23 --------------------------------------------------------------------------------------------- OBJECTIVE:   DIAGNOSTIC FINDINGS:  N/A  PATIENT SURVEYS:  LEFS Lower Extremity Functional Score: 54 / 80 = 67.5 %  SCREENING FOR RED FLAGS: Bowel or bladder incontinence: No Spinal tumors:  No Cauda equina syndrome: No Compression fracture: No Abdominal aneurysm: No  COGNITION: Overall cognitive status: Within functional limits for tasks assessed  POSTURE: increased lumbar lordosis and weight shift left      FUNCTIONAL TESTS:  5 times sit to stand: 22s; pt with increased pain; wt shift to the left lower extremity    GAIT ANALYSIS: Distance walked: 6ft Assistive device utilized: None Level of assistance: Complete Independence Comments: patient with antalgia on right lower extremity; wt shift to the left  SENSATION: Light touch: WFL   LOWER EXTREMITY MMT:    Bilateral lower extremity grossly 4-/5 except;  right knee flexion 3/5 with pain Right ankle PF 3+/5 with pain   LOWER EXTREMITY ROM:     Bilateral lower extremity ROM are WFL  (05/28/23) right great toe extension 0* due to fusion   PALPATION: Minimal tenderness to palpation right  hamstrings --------------------------------------------------------------------------------------------- TODAY'S TREATMENT:                                                                                                                              DATE:   05/28/23  Therapeutic exercise x38minutes -Supine bilateral bridge with PT overpressure 2x10 -Supine right single leg bridge 2x10   Manual therapy x11minutes -soft tissue mobilization to right calf/ popliteus  Self Care/ADLs x100minutes  -Foot orthotic/insert education such as Toe Spacers and Metatarsal Pads for pain management  -Activity modification    05/23/23  PT Low Complexity Eval  See above Therapeutic exercise x24min -Seated HS Curls with G theraband 10x5" hold Self-Care/ADLs  -Pain management  -Activity/ position modification    PATIENT EDUCATION:  Education details: Patient educated on hamstring injury, pain management, HEP Person educated: Patient Education method: Medical illustrator Education comprehension: verbalized understanding  HOME EXERCISE PROGRAM: -Seated HS Curls with G theraband 10x5" --------------------------------------------------------------------------------------------- ASSESSMENT:  CLINICAL IMPRESSION:  Today: As per pt subjective, increased right pain/callous on right metatarsal. PT to find 0 degrees of great toe extension due to fusion. PT focused session of orthotic/ shoe insert education to help relieve right great to pain due to lack of extension needed for normal gait. PT followed by therapeutic exercise to focus on right HS strength and manual therapy on right popliteus ms. MOD tenderness to palpation and ms guarding noted on right popliteus.  ------------------------------------------------------------------------------------------------------------------ Eval: Patient is a 77 y.o. y.o. female who was seen today for physical therapy evaluation and treatment for right knee  pain, difficulty walking due to right hamstring strain. Patient presents to PT with the following objective impairments: decreased activity tolerance, decreased endurance, difficulty walking, and decreased strength. These impairments limit the patient in activities such as carrying, lifting, bending, sitting, standing, squatting, stairs, and toileting. These impairments also limit the patient in participation such as meal prep, cleaning, laundry, driving, community activity, and yard work. The patient will benefit from PT to address the limitations/impairments listed below to return to their prior level of function in the domains of  activity and participation.    PERSONAL FACTORS:  n/a  are also affecting patient's functional outcome.   REHAB POTENTIAL: Excellent  CLINICAL DECISION MAKING: Stable/uncomplicated  EVALUATION COMPLEXITY: Low  --------------------------------------------------------------------------------------------- GOALS: Goals reviewed with patient? Yes  SHORT TERM GOALS: Target date: 06/13/2023    Patient will be able to complete the Timed Up and Go Test (TUG) within 18 seconds to improve gait speed to facilitate safe ambulation around home and community  Baseline: 22s Goal status: INITIAL  2.  Patient will score a >/=  60 / 80 on the  LEFS   to demonstrate an improvement in ADL completion, stair negotiation, household/community ambulation, and self-care Baseline: 54 Goal status: INITIAL  3. Patient will be independent with a basic stretching/strengthening HEP  Baseline:  Goal status: INITIAL   LONG TERM GOALS: Target date: 07/04/2023     Patient will be able to complete the Timed Up and Go Test (TUG) within 12 seconds to improve gait speed to facilitate safe ambulation around home and community Baseline: 22s Goal status: INITIAL  2.  Patient will score a >/=  65 / 80 on the LEFS  on the  LEFS  to demonstrate an improvement in ADL completion, stair negotiation,  household/community ambulation, and self-care Baseline: 54 Goal status: INITIAL  3.  Patient will be independent with a comprehensive strengthening HEP  Baseline:  Goal status: INITIAL  --------------------------------------------------------------------------------------------- PLAN:  PT FREQUENCY: 2x/week  PT DURATION: 6 weeks  PLANNED INTERVENTIONS: Therapeutic exercises, Therapeutic activity, Neuromuscular re-education, Balance training, Gait training, Patient/Family education, Self Care, and Joint mobilization.  PLAN FOR NEXT SESSION: Progress right hamstring strengthening/ ROM as tolerated    Seymour Bars, PT 05/28/2023, 1:50 PM

## 2023-05-30 ENCOUNTER — Ambulatory Visit (HOSPITAL_COMMUNITY): Payer: Medicare HMO

## 2023-05-30 DIAGNOSIS — R262 Difficulty in walking, not elsewhere classified: Secondary | ICD-10-CM | POA: Diagnosis not present

## 2023-05-30 DIAGNOSIS — S76311S Strain of muscle, fascia and tendon of the posterior muscle group at thigh level, right thigh, sequela: Secondary | ICD-10-CM

## 2023-05-30 DIAGNOSIS — M25561 Pain in right knee: Secondary | ICD-10-CM | POA: Diagnosis not present

## 2023-05-30 NOTE — Therapy (Signed)
[facility] OUTPATIENT PHYSICAL THERAPY lower extremity EVALUATION   Patient Name: Margaret Murray MRN: [id number] DOB:03-Mar-1946, 77 y.o., female Today's Date: 05/30/2023  END OF SESSION:   PT End of Session - 05/30/23 1608     Visit Number 3    Number of Visits 12    Date for PT Re-Evaluation 07/04/23    Authorization Type [employer] MD HMO    Progress Note Due on Visit 10    PT Start Time 1515    PT Stop Time 1600    PT Time Calculation (min) 45 min    Activity Tolerance Patient tolerated treatment well    Behavior During Therapy [facility] for tasks assessed/performed               No past medical history on file. Past Surgical History:  Procedure Laterality Date   ABDOMINAL HYSTERECTOMY     ARTHRODESIS METATARSALPHALANGEAL JOINT (MTPJ) Right 11/21/2021   Procedure: RIGHT FIRST METATARSOPHALANGEAL ARTHRODESIS;  Surgeon: Terance Hart, MD;  Location: [facility];  Service: Orthopedics;  Laterality: Right;  LENGHT OF SURGERY: 60 MINUTES   BREAST BIOPSY     BREAST EXCISIONAL BIOPSY Right    COLONOSCOPY N/A 01/23/2017   Procedure: COLONOSCOPY;  Surgeon: Corbin Ade, MD;  [facility];  Service: Endoscopy;  Laterality: N/A;  8:30   Patient Active Problem List   Diagnosis Date Noted   Right foot pain 11/21/2021    PCP: Fleet Contras, MDRef Provider (PCP)   REFERRING PROVIDER:   Marcene Corning, MD    REFERRING DIAG: HAMSTRING AND GASTROC STRAINS   Rationale for Evaluation and Treatment: Rehabilitation  THERAPY DIAG:  Difficulty in walking, not elsewhere classified  Acute pain of right knee  Strain of right hamstring, sequela  ONSET DATE: 04/22/23 ---------------------------------------------------------------------------------------------                                                                                                                                                                                      SUBJECTIVE  STATEMENT:  Today: Patient states she attempted HEP with no increased pain.   ---------------------------------------------------------------------------------------------------------------------- Eval: In July 2024, patient was running from her home to the car and felt a strong pull in her right lower extremity when arriving to the car. 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 Patient began to notice increased right HS pain.   PERTINENT HISTORY:  Right metatarsal fusion/ bunion surgery 2023  PAIN:  Are you having pain? Yes: NPRS scale: 3-4/10 Pain location: right lower extremity behind knee Pain description: dull/achy Aggravating factors: standing, walking, bending  Relieving factors:  rest  PRECAUTIONS: None  RED FLAGS: None   WEIGHT BEARING RESTRICTIONS: No  FALLS:  Has patient fallen in last 6 months? 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   PLOF: Independent  PATIENT GOALS: to walk pain free  NEXT MD VISIT: 06/03/23 --------------------------------------------------------------------------------------------- OBJECTIVE:   DIAGNOSTIC FINDINGS:  N/A  PATIENT SURVEYS:  LEFS Lower Extremity Functional Score: 54 / 80 = 67.5 %  SCREENING FOR RED FLAGS: Bowel or bladder incontinence: No Spinal tumors: No Cauda equina syndrome: No Compression fracture: No Abdominal aneurysm: No  COGNITION: Overall cognitive status: Within functional limits for tasks assessed  POSTURE: increased lumbar lordosis and weight shift left      FUNCTIONAL TESTS:  5 times sit to stand: 22s; pt with increased pain; wt shift to the left lower extremity    GAIT ANALYSIS: Distance walked: 67ft Assistive device utilized: None Level of assistance: Complete Independence Comments: patient with antalgia on  right lower extremity; wt shift to the left  SENSATION: Light touch: WFL   LOWER EXTREMITY MMT:    Bilateral lower extremity grossly 4-/5 except;  right knee flexion 3/5 with pain Right ankle PF 3+/5 with pain   LOWER EXTREMITY ROM:     Bilateral lower extremity ROM are WFL  (05/28/23) right great toe extension 0* due to fusion   PALPATION: Minimal tenderness to palpation right hamstrings --------------------------------------------------------------------------------------------- TODAY'S TREATMENT:                                                                                                                              DATE:  05/30/23  Manual therapy x44min -soft tissue mobilization to posterior right knee(calf, HS, popliteus)  Neuromuscular re-education x86min -Single leg bridge on the right with HS activation through big toe -Hip hinge to wall, partial, with 10#     05/28/23  Therapeutic exercise x46minutes -Supine bilateral bridge with PT overpressure 2x10 -Supine right single leg bridge 2x10   Manual therapy x24minutes -soft tissue mobilization to right calf/ popliteus  Self Care/ADLs x1minutes  -Foot orthotic/insert education such as Toe Spacers and Metatarsal Pads for pain management  -Activity modification    05/23/23  PT Low Complexity Eval  See above Therapeutic exercise x30min -Seated HS Curls with G theraband 10x5" hold Self-Care/ADLs  -Pain management  -Activity/ position modification    PATIENT EDUCATION:  Education details: Patient educated on hamstring injury, pain management, HEP Person educated: Patient Education method: Medical illustrator Education comprehension: verbalized understanding  HOME EXERCISE PROGRAM: -Seated HS Curls with G theraband 10x5" --------------------------------------------------------------------------------------------- ASSESSMENT:  CLINICAL IMPRESSION:  Today: No ms guarding/ tenderness to  palpation noted today in right post knee during soft tissue mobilization. Session progressed to closed kinematic chain right HS strengthening with no increase in pain.    ------------------------------------------------------------------------------------------------------------------ Eval: Patient is a 77 y.o. y.o. female who was  seen today for physical therapy evaluation and treatment for right knee pain, difficulty walking due to right hamstring strain. Patient presents to PT with the following objective impairments: decreased activity tolerance, decreased endurance, difficulty walking, and decreased strength. These impairments limit the patient in activities such as carrying, lifting, bending, sitting, standing, squatting, stairs, and toileting. These impairments also limit the patient in participation such as meal prep, cleaning, laundry, driving, community activity, and yard work. The patient will benefit from PT to address the limitations/impairments listed below to return to their prior level of function in the domains of activity and participation.    PERSONAL FACTORS:  n/a  are also affecting patient's functional outcome.   REHAB POTENTIAL: Excellent  CLINICAL DECISION MAKING: Stable/uncomplicated  EVALUATION COMPLEXITY: Low  --------------------------------------------------------------------------------------------- GOALS: Goals reviewed with patient? Yes  SHORT TERM GOALS: Target date: 06/13/2023    Patient will be able to complete the Timed Up and Go Test (TUG) within 18 seconds to improve gait speed to facilitate safe ambulation around home and community  Baseline: 22s Goal status: INITIAL  2.  Patient will score a >/=  60 / 80 on the  LEFS   to demonstrate an improvement in ADL completion, stair negotiation, household/community ambulation, and self-care Baseline: 54 Goal status: INITIAL  3. Patient will be independent with a basic stretching/strengthening HEP  Baseline:   Goal status: INITIAL   LONG TERM GOALS: Target date: 07/04/2023     Patient will be able to complete the Timed Up and Go Test (TUG) within 12 seconds to improve gait speed to facilitate safe ambulation around home and community Baseline: 22s Goal status: INITIAL  2.  Patient will score a >/=  65 / 80 on the LEFS  on the  LEFS  to demonstrate an improvement in ADL completion, stair negotiation, household/community ambulation, and self-care Baseline: 54 Goal status: INITIAL  3.  Patient will be independent with a comprehensive strengthening HEP  Baseline:  Goal status: INITIAL  --------------------------------------------------------------------------------------------- PLAN:  PT FREQUENCY: 2x/week  PT DURATION: 6 weeks  PLANNED INTERVENTIONS: Therapeutic exercises, Therapeutic activity, Neuromuscular re-education, Balance training, Gait training, Patient/Family education, Self Care, and Joint mobilization.  PLAN FOR NEXT SESSION: Progress right hamstring strengthening in standing    Seymour Bars, PT 05/30/2023, 4:11 PM

## 2023-06-04 ENCOUNTER — Ambulatory Visit (HOSPITAL_COMMUNITY): Payer: Medicare HMO

## 2023-06-04 DIAGNOSIS — R262 Difficulty in walking, not elsewhere classified: Secondary | ICD-10-CM | POA: Diagnosis not present

## 2023-06-04 DIAGNOSIS — S76311S Strain of muscle, fascia and tendon of the posterior muscle group at thigh level, right thigh, sequela: Secondary | ICD-10-CM | POA: Diagnosis not present

## 2023-06-04 DIAGNOSIS — M25561 Pain in right knee: Secondary | ICD-10-CM | POA: Diagnosis not present

## 2023-06-04 NOTE — Therapy (Signed)
[facility] OUTPATIENT PHYSICAL THERAPY lower extremity EVALUATION   Patient Name: Margaret Murray MRN: [id number] DOB:[birth year], 77 y.o., female Today's Date: 06/04/2023  END OF SESSION:   PT End of Session - 06/04/23 1353     Visit Number 4    Number of Visits 12    Date for PT Re-Evaluation 07/04/23    Authorization Type [employer] MD HMO    Progress Note Due on Visit 10    PT Start Time 1353    PT Stop Time 1425    PT Time Calculation (min) 32 min    Activity Tolerance Patient tolerated treatment well    Behavior During Therapy WFL for tasks assessed/performed                No past medical history on file. Past Surgical History:  Procedure Laterality Date   ABDOMINAL HYSTERECTOMY     ARTHRODESIS METATARSALPHALANGEAL JOINT (MTPJ) Right 11/21/2021   Procedure: RIGHT FIRST METATARSOPHALANGEAL ARTHRODESIS;  Surgeon: Terance Hart, MD;  Location: [facility];  Service: Orthopedics;  Laterality: Right;  LENGHT OF SURGERY: 60 MINUTES   BREAST BIOPSY     BREAST EXCISIONAL BIOPSY Right    COLONOSCOPY N/A 01/23/2017   Procedure: COLONOSCOPY;  Surgeon: Corbin Ade, MD;  [facility]: [facility];  Service: Endoscopy;  Laterality: N/A;  8:30   Patient Active Problem List   Diagnosis Date Noted   Right foot pain 11/21/2021    PCP: Fleet Contras, MDRef Provider (PCP)   REFERRING PROVIDER:   Marcene Corning, MD    REFERRING DIAG: HAMSTRING AND GASTROC STRAINS   Rationale for Evaluation and Treatment: Rehabilitation  THERAPY DIAG:  No diagnosis found.  ONSET DATE: 04/22/23 ---------------------------------------------------------------------------------------------                                                                                                                                                                                      SUBJECTIVE STATEMENT:  Today: Patient states she attempted HEP with no increased pain.    ---------------------------------------------------------------------------------------------------------------------- Eval: In July 2024, patient was running from her home to the car and felt a strong pull in her right lower extremity when arriving to the car. 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 Patient began to notice increased right HS pain.   PERTINENT HISTORY:  Right metatarsal fusion/ bunion surgery 2023  PAIN:  Are you having pain? Yes: NPRS scale: 3-4/10 Pain location: right lower extremity behind knee Pain description: dull/achy Aggravating factors: standing, walking, bending  Relieving factors: rest  PRECAUTIONS: None  RED FLAGS: None   WEIGHT BEARING RESTRICTIONS: No  FALLS:  Has patient fallen in last 6 months? 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   PLOF: Independent  PATIENT GOALS: to walk pain free  NEXT MD VISIT: 06/03/23 --------------------------------------------------------------------------------------------- OBJECTIVE:   DIAGNOSTIC FINDINGS:  N/A  PATIENT SURVEYS:  LEFS Lower Extremity Functional Score: 54 / 80 = 67.5 %  SCREENING FOR RED FLAGS: Bowel or bladder incontinence: No Spinal tumors: No Cauda equina syndrome: No Compression fracture: No Abdominal aneurysm: No  COGNITION: Overall cognitive status: Within functional limits for tasks assessed  POSTURE: increased lumbar lordosis and weight shift left      FUNCTIONAL TESTS:  5 times sit to stand: 22s; pt with increased pain; wt shift to the left lower extremity    GAIT ANALYSIS: Distance walked: 61ft Assistive device utilized: None Level of assistance: Complete Independence Comments: patient with antalgia on right lower extremity; wt shift to the left  SENSATION: Light touch:  WFL   LOWER EXTREMITY MMT:    Bilateral lower extremity grossly 4-/5 except;  right knee flexion 3/5 with pain Right ankle PF 3+/5 with pain   LOWER EXTREMITY ROM:     Bilateral lower extremity ROM are WFL  (05/28/23) right great toe extension 0* due to fusion   PALPATION: Minimal tenderness to palpation right hamstrings --------------------------------------------------------------------------------------------- TODAY'S TREATMENT:                                                                                                                              DATE:   05/30/23  Orthotic Fit Training x15 min -Metarsal pad shoe inserts with education on use/placement -Toe spacer insert Therapeutic exercise x18min -Standing HR with towel, then half foam 2x10 each   05/30/23 Manual therapy x44min -soft tissue mobilization to posterior right knee(calf, HS, popliteus)  Neuromuscular re-education x27min -Single leg bridge on the right with HS activation through big toe -Hip hinge to wall, partial, with 10#     05/28/23  Therapeutic exercise x80minutes -Supine bilateral bridge with PT overpressure 2x10 -Supine right single leg bridge 2x10   Manual therapy x22minutes -soft tissue mobilization to right calf/ popliteus  Self Care/ADLs x47minutes  -Foot orthotic/insert education such as Toe Spacers and Metatarsal Pads for pain management  -Activity modification    PATIENT EDUCATION:  Education details: Patient educated on hamstring injury, pain management, HEP Person educated: Patient Education method: Medical illustrator Education comprehension: verbalized understanding  HOME EXERCISE PROGRAM: -Seated HS Curls with G theraband 10x5" --------------------------------------------------------------------------------------------- ASSESSMENT:  CLINICAL IMPRESSION:  Today: PT began session on orthotic insert use/placement in footwear followed by lower extremity  strengthening. Pt tolerated with no increase in pain.    ------------------------------------------------------------------------------------------------------------------ Eval: Patient is a 77 y.o. y.o. female who was seen today for physical therapy evaluation and treatment for right knee pain, difficulty walking due to right hamstring strain. Patient presents to PT  with the following objective impairments: decreased activity tolerance, decreased endurance, difficulty walking, and decreased strength. These impairments limit the patient in activities such as carrying, lifting, bending, sitting, standing, squatting, stairs, and toileting. These impairments also limit the patient in participation such as meal prep, cleaning, laundry, driving, community activity, and yard work. The patient will benefit from PT to address the limitations/impairments listed below to return to their prior level of function in the domains of activity and participation.    PERSONAL FACTORS:  n/a  are also affecting patient's functional outcome.   REHAB POTENTIAL: Excellent  CLINICAL DECISION MAKING: Stable/uncomplicated  EVALUATION COMPLEXITY: Low  --------------------------------------------------------------------------------------------- GOALS: Goals reviewed with patient? Yes  SHORT TERM GOALS: Target date: 06/13/2023    Patient will be able to complete the Timed Up and Go Test (TUG) within 18 seconds to improve gait speed to facilitate safe ambulation around home and community  Baseline: 22s Goal status: INITIAL  2.  Patient will score a >/=  60 / 80 on the  LEFS   to demonstrate an improvement in ADL completion, stair negotiation, household/community ambulation, and self-care Baseline: 54 Goal status: INITIAL  3. Patient will be independent with a basic stretching/strengthening HEP  Baseline:  Goal status: INITIAL   LONG TERM GOALS: Target date: 07/04/2023     Patient will be able to complete the  Timed Up and Go Test (TUG) within 12 seconds to improve gait speed to facilitate safe ambulation around home and community Baseline: 22s Goal status: INITIAL  2.  Patient will score a >/=  65 / 80 on the LEFS  on the  LEFS  to demonstrate an improvement in ADL completion, stair negotiation, household/community ambulation, and self-care Baseline: 54 Goal status: INITIAL  3.  Patient will be independent with a comprehensive strengthening HEP  Baseline:  Goal status: INITIAL  --------------------------------------------------------------------------------------------- PLAN:  PT FREQUENCY: 2x/week  PT DURATION: 6 weeks  PLANNED INTERVENTIONS: Therapeutic exercises, Therapeutic activity, Neuromuscular re-education, Balance training, Gait training, Patient/Family education, Self Care, and Joint mobilization.  PLAN FOR NEXT SESSION: Progress right hamstring strengthening in standing    Seymour Bars, PT 06/04/2023, 2:21 PM

## 2023-06-06 ENCOUNTER — Ambulatory Visit (HOSPITAL_COMMUNITY): Payer: Medicare HMO

## 2023-06-06 DIAGNOSIS — R262 Difficulty in walking, not elsewhere classified: Secondary | ICD-10-CM

## 2023-06-06 DIAGNOSIS — S76311S Strain of muscle, fascia and tendon of the posterior muscle group at thigh level, right thigh, sequela: Secondary | ICD-10-CM | POA: Diagnosis not present

## 2023-06-06 DIAGNOSIS — M25561 Pain in right knee: Secondary | ICD-10-CM

## 2023-06-06 NOTE — Therapy (Signed)
[facility] OUTPATIENT PHYSICAL THERAPY lower extremity treatment   Patient Name: Margaret Murray MRN: [id number] DOB:12-31-1945, 77 y.o., female Today's Date: 06/06/2023  END OF SESSION:   PT End of Session - 06/06/23 1346     Visit Number 5    Number of Visits 12    Date for PT Re-Evaluation 07/04/23    Authorization Type [employer] MD HMO    Progress Note Due on Visit 10    PT Start Time 1345    PT Stop Time 1425    PT Time Calculation (min) 40 min    Activity Tolerance Patient tolerated treatment well    Behavior During Therapy [facility] for tasks assessed/performed                 No past medical history on file. Past Surgical History:  Procedure Laterality Date   ABDOMINAL HYSTERECTOMY     ARTHRODESIS METATARSALPHALANGEAL JOINT (MTPJ) Right 11/21/2021   Procedure: RIGHT FIRST METATARSOPHALANGEAL ARTHRODESIS;  Surgeon: Terance Hart, MD;  Location: [facility];  Service: Orthopedics;  Laterality: Right;  LENGHT OF SURGERY: 60 MINUTES   BREAST BIOPSY     BREAST EXCISIONAL BIOPSY Right    COLONOSCOPY N/A 01/23/2017   Procedure: COLONOSCOPY;  Surgeon: Corbin Ade, MD;  [facility];  Service: Endoscopy;  Laterality: N/A;  8:30   Patient Active Problem List   Diagnosis Date Noted   Right foot pain 11/21/2021    PCP: Fleet Contras, MDRef Provider (PCP)   REFERRING PROVIDER:   Marcene Corning, MD    REFERRING DIAG: HAMSTRING AND GASTROC STRAINS   Rationale for Evaluation and Treatment: Rehabilitation  THERAPY DIAG:  Difficulty in walking, not elsewhere classified  Acute pain of right knee  Strain of right hamstring, sequela  ONSET DATE: 04/22/23 ---------------------------------------------------------------------------------------------                                                                                                                                                                                      SUBJECTIVE  STATEMENT:  Today: Patient states she feels much better  ------------------------------------------------------------------------------------------------------------------ Eval: In July 2024, patient was running from her home to the car and felt a strong pull in her right lower extremity when arriving to the car. 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 Patient began to notice increased right HS pain.   PERTINENT HISTORY:  Right metatarsal fusion/ bunion surgery 2023  PAIN:  Are you having pain? Yes: NPRS scale: 3-4/10 Pain location: right lower extremity behind knee Pain description: dull/achy Aggravating factors: standing, walking, bending  Relieving factors: rest  PRECAUTIONS: None  RED FLAGS: None   WEIGHT BEARING RESTRICTIONS: No  FALLS:  Has patient fallen in last 6 months? 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   PLOF: Independent  PATIENT GOALS: to walk pain free  NEXT MD VISIT: 06/03/23 --------------------------------------------------------------------------------------------- OBJECTIVE:   DIAGNOSTIC FINDINGS:  N/A  PATIENT SURVEYS:  LEFS Lower Extremity Functional Score: 54 / 80 = 67.5 %  SCREENING FOR RED FLAGS: Bowel or bladder incontinence: No Spinal tumors: No Cauda equina syndrome: No Compression fracture: No Abdominal aneurysm: No  COGNITION: Overall cognitive status: Within functional limits for tasks assessed  POSTURE: increased lumbar lordosis and weight shift left      FUNCTIONAL TESTS:  5 times sit to stand: 22s; pt with increased pain; wt shift to the left lower extremity    GAIT ANALYSIS: Distance walked: 67ft Assistive device utilized: None Level of assistance: Complete Independence Comments: patient with antalgia on right lower extremity; wt  shift to the left  SENSATION: Light touch: WFL   LOWER EXTREMITY MMT:    Bilateral lower extremity grossly 4-/5 except;  right knee flexion 3/5 with pain Right ankle PF 3+/5 with pain   LOWER EXTREMITY ROM:     Bilateral lower extremity ROM are WFL  (05/28/23) right great toe extension 0* due to fusion   PALPATION: Minimal tenderness to palpation right hamstrings --------------------------------------------------------------------------------------------- TODAY'S TREATMENT:                                                                                                                              DATE:   06/06/23 Therapeutic Activity x38min -Step up/downs on 4" steps in //bars -Partial lunges to 6" step + foam pad in // bars, x10, bilateral  Neuromuscular re-education x15 -Deadlifts with 15# to 6' step, stiff legged, bent legged 3x10       05/30/23 Orthotic Fit Training x15 min -Metarsal pad shoe inserts with education on use/placement -Toe spacer insert Therapeutic exercise x74min -Standing HR with towel, then half foam 2x10 each   05/30/23 Manual therapy x34min -soft tissue mobilization to posterior right knee(calf, HS, popliteus)  Neuromuscular re-education x84min -Single leg bridge on the right with HS activation through big toe -Hip hinge to wall, partial, with 10#     05/28/23  Therapeutic exercise x56minutes -Supine bilateral bridge with PT overpressure 2x10 -Supine right single leg bridge 2x10   Manual therapy x9minutes -soft tissue mobilization to right calf/ popliteus  Self Care/ADLs x35minutes  -Foot orthotic/insert education such as Toe Spacers and Metatarsal Pads for pain management  -Activity modification    PATIENT EDUCATION:  Education details: Patient educated on hamstring injury, pain management, HEP Person educated: Patient Education method: Medical illustrator Education comprehension: verbalized understanding  HOME  EXERCISE PROGRAM: -Seated HS Curls with G theraband 10x5" --------------------------------------------------------------------------------------------- ASSESSMENT:  CLINICAL IMPRESSION:  Today: PT  began session on orthotic insert use/placement in footwear followed by lower extremity strengthening. Pt tolerated with no increase in pain.    ------------------------------------------------------------------------------------------------------------------ Eval: Patient is a 77 y.o. y.o. female who was seen today for physical therapy evaluation and treatment for right knee pain, difficulty walking due to right hamstring strain. Patient presents to PT with the following objective impairments: decreased activity tolerance, decreased endurance, difficulty walking, and decreased strength. These impairments limit the patient in activities such as carrying, lifting, bending, sitting, standing, squatting, stairs, and toileting. These impairments also limit the patient in participation such as meal prep, cleaning, laundry, driving, community activity, and yard work. The patient will benefit from PT to address the limitations/impairments listed below to return to their prior level of function in the domains of activity and participation.    PERSONAL FACTORS:  n/a  are also affecting patient's functional outcome.   REHAB POTENTIAL: Excellent  CLINICAL DECISION MAKING: Stable/uncomplicated  EVALUATION COMPLEXITY: Low  --------------------------------------------------------------------------------------------- GOALS: Goals reviewed with patient? Yes  SHORT TERM GOALS: Target date: 06/13/2023    Patient will be able to complete the Timed Up and Go Test (TUG) within 18 seconds to improve gait speed to facilitate safe ambulation around home and community  Baseline: 22s Goal status: INITIAL  2.  Patient will score a >/=  60 / 80 on the  LEFS   to demonstrate an improvement in ADL completion, stair  negotiation, household/community ambulation, and self-care Baseline: 54 Goal status: INITIAL  3. Patient will be independent with a basic stretching/strengthening HEP  Baseline:  Goal status: INITIAL   LONG TERM GOALS: Target date: 07/04/2023     Patient will be able to complete the Timed Up and Go Test (TUG) within 12 seconds to improve gait speed to facilitate safe ambulation around home and community Baseline: 22s Goal status: INITIAL  2.  Patient will score a >/=  65 / 80 on the LEFS  on the  LEFS  to demonstrate an improvement in ADL completion, stair negotiation, household/community ambulation, and self-care Baseline: 54 Goal status: INITIAL  3.  Patient will be independent with a comprehensive strengthening HEP  Baseline:  Goal status: INITIAL  --------------------------------------------------------------------------------------------- PLAN:  PT FREQUENCY: 2x/week  PT DURATION: 6 weeks  PLANNED INTERVENTIONS: Therapeutic exercises, Therapeutic activity, Neuromuscular re-education, Balance training, Gait training, Patient/Family education, Self Care, and Joint mobilization.  PLAN FOR NEXT SESSION: Progress right hamstring strengthening in standing    Seymour Bars, PT 06/06/2023, 1:47 PM

## 2023-06-12 ENCOUNTER — Encounter (HOSPITAL_COMMUNITY): Payer: Medicare HMO

## 2023-06-14 ENCOUNTER — Ambulatory Visit (HOSPITAL_COMMUNITY): Payer: Medicare HMO | Attending: Orthopaedic Surgery

## 2023-06-14 DIAGNOSIS — R262 Difficulty in walking, not elsewhere classified: Secondary | ICD-10-CM | POA: Diagnosis not present

## 2023-06-14 DIAGNOSIS — M25561 Pain in right knee: Secondary | ICD-10-CM | POA: Insufficient documentation

## 2023-06-14 DIAGNOSIS — S76311S Strain of muscle, fascia and tendon of the posterior muscle group at thigh level, right thigh, sequela: Secondary | ICD-10-CM | POA: Insufficient documentation

## 2023-06-14 NOTE — Therapy (Deleted)
[facility] OUTPATIENT PHYSICAL THERAPY lower extremity treatment   Patient Name: Margaret Murray MRN: [id number] DOB:10/31/1945, 77 y.o., female Today's Date: 06/14/2023  END OF SESSION:   PT End of Session - 06/14/23 1434     Visit Number 6    Number of Visits 12    Date for PT Re-Evaluation 07/04/23    Authorization Type [employer] MD HMO    Progress Note Due on Visit 10    PT Start Time 1430    PT Stop Time 1510    PT Time Calculation (min) 40 min    Activity Tolerance Patient tolerated treatment well    Behavior During Therapy [facility] for tasks assessed/performed                 No past medical history on file. Past Surgical History:  Procedure Laterality Date   ABDOMINAL HYSTERECTOMY     ARTHRODESIS METATARSALPHALANGEAL JOINT (MTPJ) Right 11/21/2021   Procedure: RIGHT FIRST METATARSOPHALANGEAL ARTHRODESIS;  Surgeon: Terance Hart, MD;  Location: [facility];  Service: Orthopedics;  Laterality: Right;  LENGHT OF SURGERY: 60 MINUTES   BREAST BIOPSY     BREAST EXCISIONAL BIOPSY Right    COLONOSCOPY N/A 01/23/2017   Procedure: COLONOSCOPY;  Surgeon: Corbin Ade, MD;  [facility];  Service: Endoscopy;  Laterality: N/A;  8:30   Patient Active Problem List   Diagnosis Date Noted   Right foot pain 11/21/2021    PCP: Fleet Contras, MDRef Provider (PCP)   REFERRING PROVIDER:   Marcene Corning, MD    REFERRING DIAG: HAMSTRING AND GASTROC STRAINS   Rationale for Evaluation and Treatment: Rehabilitation  THERAPY DIAG:  No diagnosis found.  ONSET DATE: 04/22/23 ---------------------------------------------------------------------------------------------                                                                                                                                                                                      SUBJECTIVE STATEMENT:  Today: Patient states she feels much better; would like to finish PT within the next visit  or so   ------------------------------------------------------------------------------------------------------------------ Eval: In July 2024, patient was running from her home to the car and felt a strong pull in her right lower extremity when arriving to the car. 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 Patient began to notice increased right HS pain.   PERTINENT HISTORY:  Right metatarsal fusion/ bunion surgery 2023  PAIN:  Are you having pain? Yes: NPRS scale: 3-4/10 Pain location: right lower extremity behind knee Pain description: dull/achy Aggravating factors: standing, walking, bending  Relieving factors: rest  PRECAUTIONS: None  RED FLAGS: None   WEIGHT BEARING RESTRICTIONS: No  FALLS:  Has patient fallen in last 6 months? 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   PLOF: Independent  PATIENT GOALS: to walk pain free  NEXT MD VISIT: 06/03/23 --------------------------------------------------------------------------------------------- OBJECTIVE:   DIAGNOSTIC FINDINGS:  N/A  PATIENT SURVEYS:  LEFS Lower Extremity Functional Score: 54 / 80 = 67.5 %  SCREENING FOR RED FLAGS: Bowel or bladder incontinence: No Spinal tumors: No Cauda equina syndrome: No Compression fracture: No Abdominal aneurysm: No  COGNITION: Overall cognitive status: Within functional limits for tasks assessed  POSTURE: increased lumbar lordosis and weight shift left      FUNCTIONAL TESTS:  5 times sit to stand: 22s; pt with increased pain; wt shift to the left lower extremity    GAIT ANALYSIS: Distance walked: 73ft Assistive device utilized: None Level of assistance: Complete Independence Comments: patient with antalgia on right lower extremity; wt shift to the left  SENSATION: Light touch:  WFL   LOWER EXTREMITY MMT:    Bilateral lower extremity grossly 4-/5 except;  right knee flexion 3/5 with pain Right ankle PF 3+/5 with pain   LOWER EXTREMITY ROM:     Bilateral lower extremity ROM are WFL  (05/28/23) right great toe extension 0* due to fusion   PALPATION: Minimal tenderness to palpation right hamstrings --------------------------------------------------------------------------------------------- TODAY'S TREATMENT:                                                                                                                              DATE:   06/06/23 Therapeutic Activity x4min -Step up/downs on 4" steps in //bars -Partial lunges to 6" step + foam pad in // bars, x10, bilateral  Neuromuscular re-education x15 -Deadlifts with 15# to 6' step, stiff legged, bent legged 3x10       05/30/23 Orthotic Fit Training x15 min -Metarsal pad shoe inserts with education on use/placement -Toe spacer insert Therapeutic exercise x7min -Standing HR with towel, then half foam 2x10 each   05/30/23 Manual therapy x58min -soft tissue mobilization to posterior right knee(calf, HS, popliteus)  Neuromuscular re-education x81min -Single leg bridge on the right with HS activation through big toe -Hip hinge to wall, partial, with 10#     05/28/23  Therapeutic exercise x47minutes -Supine bilateral bridge with PT overpressure 2x10 -Supine right single leg bridge 2x10   Manual therapy x57minutes -soft tissue mobilization to right calf/ popliteus  Self Care/ADLs x40minutes  -Foot orthotic/insert education such as Toe Spacers and Metatarsal Pads for pain management  -Activity modification    PATIENT EDUCATION:  Education details: Patient educated on hamstring injury, pain management, HEP Person educated: Patient Education method: Medical illustrator Education comprehension: verbalized understanding  HOME EXERCISE PROGRAM: -Seated HS Curls with G  theraband 10x5" --------------------------------------------------------------------------------------------- ASSESSMENT:  CLINICAL IMPRESSION:  Today: PT began session on  orthotic insert use/placement in footwear followed by lower extremity strengthening. Pt tolerated with no increase in pain.    ------------------------------------------------------------------------------------------------------------------ Eval: Patient is a 76 y.o. y.o. female who was seen today for physical therapy evaluation and treatment for right knee pain, difficulty walking due to right hamstring strain. Patient presents to PT with the following objective impairments: decreased activity tolerance, decreased endurance, difficulty walking, and decreased strength. These impairments limit the patient in activities such as carrying, lifting, bending, sitting, standing, squatting, stairs, and toileting. These impairments also limit the patient in participation such as meal prep, cleaning, laundry, driving, community activity, and yard work. The patient will benefit from PT to address the limitations/impairments listed below to return to their prior level of function in the domains of activity and participation.    PERSONAL FACTORS:  n/a  are also affecting patient's functional outcome.   REHAB POTENTIAL: Excellent  CLINICAL DECISION MAKING: Stable/uncomplicated  EVALUATION COMPLEXITY: Low  --------------------------------------------------------------------------------------------- GOALS: Goals reviewed with patient? Yes  SHORT TERM GOALS: Target date: 06/13/2023    Patient will be able to complete the Timed Up and Go Test (TUG) within 18 seconds to improve gait speed to facilitate safe ambulation around home and community  Baseline: 22s Goal status: INITIAL  2.  Patient will score a >/=  60 / 80 on the  LEFS   to demonstrate an improvement in ADL completion, stair negotiation, household/community ambulation, and  self-care Baseline: 54 Goal status: INITIAL  3. Patient will be independent with a basic stretching/strengthening HEP  Baseline:  Goal status: INITIAL   LONG TERM GOALS: Target date: 07/04/2023     Patient will be able to complete the Timed Up and Go Test (TUG) within 12 seconds to improve gait speed to facilitate safe ambulation around home and community Baseline: 22s Goal status: INITIAL  2.  Patient will score a >/=  65 / 80 on the LEFS  on the  LEFS  to demonstrate an improvement in ADL completion, stair negotiation, household/community ambulation, and self-care Baseline: 54 Goal status: INITIAL  3.  Patient will be independent with a comprehensive strengthening HEP  Baseline:  Goal status: INITIAL  --------------------------------------------------------------------------------------------- PLAN:  PT FREQUENCY: 2x/week  PT DURATION: 6 weeks  PLANNED INTERVENTIONS: Therapeutic exercises, Therapeutic activity, Neuromuscular re-education, Balance training, Gait training, Patient/Family education, Self Care, and Joint mobilization.  PLAN FOR NEXT SESSION: Progress right hamstring strengthening in standing    Seymour Bars, PT 06/14/2023, 2:47 PM

## 2023-06-14 NOTE — Therapy (Signed)
[facility] OUTPATIENT PHYSICAL THERAPY lower extremity treatment   Patient Name: Margaret Murray MRN: [id number] DOB:1946-06-12, 77 y.o., female Today's Date: 06/14/2023  END OF SESSION:   PT End of Session - 06/14/23 1434     Visit Number 6    Number of Visits 12    Date for PT Re-Evaluation 07/04/23    Authorization Type [employer] MD HMO    Progress Note Due on Visit 10    PT Start Time 1430    PT Stop Time 1510    PT Time Calculation (min) 40 min    Activity Tolerance Patient tolerated treatment well    Behavior During Therapy [facility] for tasks assessed/performed                 No past medical history on file. Past Surgical History:  Procedure Laterality Date   ABDOMINAL HYSTERECTOMY     ARTHRODESIS METATARSALPHALANGEAL JOINT (MTPJ) Right 11/21/2021   Procedure: RIGHT FIRST METATARSOPHALANGEAL ARTHRODESIS;  Surgeon: Terance Hart, MD;  Location: [facility];  Service: Orthopedics;  Laterality: Right;  LENGHT OF SURGERY: 60 MINUTES   BREAST BIOPSY     BREAST EXCISIONAL BIOPSY Right    COLONOSCOPY N/A 01/23/2017   Procedure: COLONOSCOPY;  Surgeon: Corbin Ade, MD;  [facility];  Service: Endoscopy;  Laterality: N/A;  8:30   Patient Active Problem List   Diagnosis Date Noted   Right foot pain 11/21/2021    PCP: Fleet Contras, MDRef Provider (PCP)   REFERRING PROVIDER:   Marcene Corning, MD    REFERRING DIAG: HAMSTRING AND GASTROC STRAINS   Rationale for Evaluation and Treatment: Rehabilitation  THERAPY DIAG:  No diagnosis found.  ONSET DATE: 04/22/23 ---------------------------------------------------------------------------------------------                                                                                                                                                                                      SUBJECTIVE STATEMENT:  Today: Patient states she feels much better; is happy with progress in PT and would like 1x  more session  ------------------------------------------------------------------------------------------------------------------ Eval: In July 2024, patient was running from her home to the car and felt a strong pull in her right lower extremity when arriving to the car. 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 Patient began to notice increased right HS pain.   PERTINENT HISTORY:  Right metatarsal fusion/ bunion surgery 2023  PAIN:  Are you having pain? Yes: NPRS scale: 3-4/10 Pain location: right lower extremity behind knee Pain description: dull/achy Aggravating factors: standing, walking, bending  Relieving factors: rest  PRECAUTIONS: None  RED FLAGS: None   WEIGHT BEARING RESTRICTIONS: No  FALLS:  Has patient fallen in last 6 months? 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   PLOF: Independent  PATIENT GOALS: to walk pain free  NEXT MD VISIT: 06/03/23 --------------------------------------------------------------------------------------------- OBJECTIVE:   DIAGNOSTIC FINDINGS:  N/A  PATIENT SURVEYS:  LEFS Lower Extremity Functional Score: 54 / 80 = 67.5 %  SCREENING FOR RED FLAGS: Bowel or bladder incontinence: No Spinal tumors: No Cauda equina syndrome: No Compression fracture: No Abdominal aneurysm: No  COGNITION: Overall cognitive status: Within functional limits for tasks assessed  POSTURE: increased lumbar lordosis and weight shift left      FUNCTIONAL TESTS:  5 times sit to stand: 22s; pt with increased pain; wt shift to the left lower extremity    GAIT ANALYSIS: Distance walked: 33ft Assistive device utilized: None Level of assistance: Complete Independence Comments: patient with antalgia on right lower extremity; wt shift to the left  SENSATION: Light touch:  WFL   LOWER EXTREMITY MMT:    Bilateral lower extremity grossly 4-/5 except;  right knee flexion 3/5 with pain Right ankle PF 3+/5 with pain   LOWER EXTREMITY ROM:     Bilateral lower extremity ROM are WFL  (05/28/23) right great toe extension 0* due to fusion   PALPATION: Minimal tenderness to palpation right hamstrings --------------------------------------------------------------------------------------------- TODAY'S TREATMENT:                                                                                                                              DATE:   06/14/23 Therapeutic exercise x 40'  -Standing partial lunges in // bars 2x10, bilateral  -Standing lunges to 6" step in // bars 2x10, bilateral  -Ankle D2 eversion/dorsiflexion versus red theraband 3x10    06/06/23 Therapeutic Activity x31min -Step up/downs on 4" steps in //bars -Partial lunges to 6" step + foam pad in // bars, x10, bilateral  Neuromuscular re-education x15 -Deadlifts with 15# to 6' step, stiff legged, bent legged 3x10   05/30/23 Orthotic Fit Training x15 min -Metarsal pad shoe inserts with education on use/placement -Toe spacer insert Therapeutic exercise x69min -Standing HR with towel, then half foam 2x10 each   05/30/23 Manual therapy x26min -soft tissue mobilization to posterior right knee(calf, HS, popliteus) Neuromuscular re-education x75min -Single leg bridge on the right with HS activation through big toe -Hip hinge to wall, partial, with 10#   PATIENT EDUCATION:  Education details: Patient educated on hamstring injury, pain management, HEP Person educated: Patient Education method: Medical illustrator Education comprehension: verbalized understanding  HOME EXERCISE PROGRAM:  --------------------------------------------------------------------------------------------- ASSESSMENT:  CLINICAL IMPRESSION:  Today: As per subjective, pt is happy with progress; only  remaining issue is right lower extremity lateral pain. PT tested eversion MMT and SLB which shows weakness in right lower extremity. PT session and HEP adjusted  to focus on right lateral lower extremity strength.   ------------------------------------------------------------------------------------------------------------------ Eval: Patient is a 77 y.o. y.o. female who was seen today for physical therapy evaluation and treatment for right knee pain, difficulty walking due to right hamstring strain. Patient presents to PT with the following objective impairments: decreased activity tolerance, decreased endurance, difficulty walking, and decreased strength. These impairments limit the patient in activities such as carrying, lifting, bending, sitting, standing, squatting, stairs, and toileting. These impairments also limit the patient in participation such as meal prep, cleaning, laundry, driving, community activity, and yard work. The patient will benefit from PT to address the limitations/impairments listed below to return to their prior level of function in the domains of activity and participation.    PERSONAL FACTORS:  n/a  are also affecting patient's functional outcome.   REHAB POTENTIAL: Excellent  CLINICAL DECISION MAKING: Stable/uncomplicated  EVALUATION COMPLEXITY: Low  --------------------------------------------------------------------------------------------- GOALS: Goals reviewed with patient? Yes  SHORT TERM GOALS: Target date: 06/13/2023    Patient will be able to complete the Timed Up and Go Test (TUG) within 18 seconds to improve gait speed to facilitate safe ambulation around home and community  Baseline: 22s Goal status: INITIAL  2.  Patient will score a >/=  60 / 80 on the  LEFS   to demonstrate an improvement in ADL completion, stair negotiation, household/community ambulation, and self-care Baseline: 54 Goal status: INITIAL  3. Patient will be independent with a basic  stretching/strengthening HEP  Baseline:  Goal status: INITIAL   LONG TERM GOALS: Target date: 07/04/2023     Patient will be able to complete the Timed Up and Go Test (TUG) within 12 seconds to improve gait speed to facilitate safe ambulation around home and community Baseline: 22s Goal status: INITIAL  2.  Patient will score a >/=  65 / 80 on the LEFS  on the  LEFS  to demonstrate an improvement in ADL completion, stair negotiation, household/community ambulation, and self-care Baseline: 54 Goal status: INITIAL  3.  Patient will be independent with a comprehensive strengthening HEP  Baseline:  Goal status: INITIAL  --------------------------------------------------------------------------------------------- PLAN:  PT FREQUENCY: 2x/week  PT DURATION: 6 weeks  PLANNED INTERVENTIONS: Therapeutic exercises, Therapeutic activity, Neuromuscular re-education, Balance training, Gait training, Patient/Family education, Self Care, and Joint mobilization.  PLAN FOR NEXT SESSION: Progress right hamstring strengthening in standing    Seymour Bars, PT 06/14/2023, 3:11 PM

## 2023-06-17 ENCOUNTER — Encounter (HOSPITAL_COMMUNITY): Payer: Medicare HMO

## 2023-06-19 ENCOUNTER — Encounter (HOSPITAL_COMMUNITY): Payer: Medicare HMO | Admitting: Physical Therapy

## 2023-06-25 ENCOUNTER — Encounter (HOSPITAL_COMMUNITY): Payer: Medicare HMO

## 2023-06-27 ENCOUNTER — Encounter (HOSPITAL_COMMUNITY): Payer: Medicare HMO | Admitting: Physical Therapy

## 2023-07-01 ENCOUNTER — Encounter (HOSPITAL_COMMUNITY): Payer: Medicare HMO

## 2023-07-04 ENCOUNTER — Encounter (HOSPITAL_COMMUNITY): Payer: Medicare HMO | Admitting: Physical Therapy

## 2023-07-08 ENCOUNTER — Ambulatory Visit (HOSPITAL_COMMUNITY): Payer: Medicare HMO

## 2023-07-08 DIAGNOSIS — M25561 Pain in right knee: Secondary | ICD-10-CM | POA: Diagnosis not present

## 2023-07-08 DIAGNOSIS — S76311S Strain of muscle, fascia and tendon of the posterior muscle group at thigh level, right thigh, sequela: Secondary | ICD-10-CM | POA: Diagnosis not present

## 2023-07-08 DIAGNOSIS — R262 Difficulty in walking, not elsewhere classified: Secondary | ICD-10-CM

## 2023-07-08 NOTE — Therapy (Signed)
[facility] OUTPATIENT PHYSICAL THERAPY lower extremity treatment   Patient Name: Margaret Murray MRN: [id number] DOB:Nov 05, 1945, 77 y.o., female Today's Date: 07/08/2023  END OF SESSION:   PT End of Session - 07/08/23 1323     Visit Number 7    Number of Visits 12    Date for PT Re-Evaluation 07/04/23    Authorization Type [employer] MD HMO    Progress Note Due on Visit 10    PT Start Time 1300    PT Stop Time 1340    PT Time Calculation (min) 40 min    Activity Tolerance Patient tolerated treatment well    Behavior During Therapy [facility] for tasks assessed/performed                 No past medical history on file. Past Surgical History:  Procedure Laterality Date   ABDOMINAL HYSTERECTOMY     ARTHRODESIS METATARSALPHALANGEAL JOINT (MTPJ) Right 11/21/2021   Procedure: RIGHT FIRST METATARSOPHALANGEAL ARTHRODESIS;  Surgeon: Terance Hart, MD;  Location: [facility];  Service: Orthopedics;  Laterality: Right;  LENGHT OF SURGERY: 60 MINUTES   BREAST BIOPSY     BREAST EXCISIONAL BIOPSY Right    COLONOSCOPY N/A 01/23/2017   Procedure: COLONOSCOPY;  Surgeon: Corbin Ade, MD;  [facility];  Service: Endoscopy;  Laterality: N/A;  8:30   Patient Active Problem List   Diagnosis Date Noted   Right foot pain 11/21/2021    PCP: Fleet Contras, MDRef Provider (PCP)   REFERRING PROVIDER:   Marcene Corning, MD    REFERRING DIAG: HAMSTRING AND GASTROC STRAINS   Rationale for Evaluation and Treatment: Rehabilitation  THERAPY DIAG:  No diagnosis found.  ONSET DATE: 04/22/23 ---------------------------------------------------------------------------------------------                                                                                                                                                                                      SUBJECTIVE STATEMENT:  Today: Patient states she feels much better. States feeling 80% better but still c/o right  lateral leg pain going down stairs    ------------------------------------------------------------------------------------------------------------------ Eval: In July 2024, patient was running from her home to the car and felt a strong pull in her right lower extremity when arriving to the car. 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 Patient began to notice increased right HS pain.   PERTINENT HISTORY:  Right metatarsal fusion/ bunion surgery 2023  PAIN:  Are you having pain? Yes: NPRS scale: 3/10 Pain location: right lower extremity- lateral Pain description: dull/achy Aggravating factors: standing, walking, bending  Relieving factors: rest  PRECAUTIONS: None  RED FLAGS: None   WEIGHT BEARING RESTRICTIONS: No  FALLS:  Has patient fallen in last 6 months? 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   PLOF: Independent  PATIENT GOALS: to walk pain free  NEXT MD VISIT: 06/03/23 --------------------------------------------------------------------------------------------- OBJECTIVE:   DIAGNOSTIC FINDINGS:  N/A  PATIENT SURVEYS:  LEFS Lower Extremity Functional Score: 54 / 80 = 67.5 %  SCREENING FOR RED FLAGS: Bowel or bladder incontinence: No Spinal tumors: No Cauda equina syndrome: No Compression fracture: No Abdominal aneurysm: No  COGNITION: Overall cognitive status: Within functional limits for tasks assessed  POSTURE: increased lumbar lordosis and weight shift left      FUNCTIONAL TESTS:  5 times sit to stand: 22s; pt with increased pain; wt shift to the left lower extremity    GAIT ANALYSIS: Distance walked: 47ft Assistive device utilized: None Level of assistance: Complete Independence Comments: patient with antalgia on right lower extremity; wt shift to the  left  SENSATION: Light touch: WFL   LOWER EXTREMITY MMT:    Bilateral lower extremity grossly 4-/5 except;  right knee flexion 3/5 with pain Right ankle PF 3+/5 with pain   LOWER EXTREMITY ROM:     Bilateral lower extremity ROM are WFL  (05/28/23) right great toe extension 0* due to fusion   PALPATION: Minimal tenderness to palpation right hamstrings --------------------------------------------------------------------------------------------- TODAY'S TREATMENT:                                                                                                                              DATE:   07/08/23 Therapeutic exercise x 40' -HEP Review - Side Stepping with Resistance at Ankles  - 20 reps - Standing Hip Flexion with Resistance Loop  - 20 reps - Standing Hip Abduction Kicks  - 20 reps  06/14/23 Therapeutic exercise x 40'  -Standing partial lunges in // bars 2x10, bilateral  -Standing lunges to 6" step in // bars 2x10, bilateral  -Ankle D2 eversion/dorsiflexion versus red theraband 3x10    06/06/23 Therapeutic Activity x54min -Step up/downs on 4" steps in //bars -Partial lunges to 6" step + foam pad in // bars, x10, bilateral  Neuromuscular re-education x15 -Deadlifts with 15# to 6' step, stiff legged, bent legged 3x10     PATIENT EDUCATION:  Education details: Patient educated on hamstring injury, pain management, HEP Person educated: Patient Education method: Medical illustrator Education comprehension: verbalized understanding  HOME EXERCISE PROGRAM: Access Code: [email or web address] URL: [email or web address] Date: 07/08/2023 Prepared by: Seymour Bars  Exercises - Figure 4 Bridge  - 20 reps - Half Deadlift with Kettlebell  - 20 reps - Side Stepping with Resistance at Ankles  - 20 reps - Standing Hip Flexion with Resistance Loop  - 20 reps - Standing Hip Abduction Kicks  - 20  reps --------------------------------------------------------------------------------------------- ASSESSMENT:  CLINICAL IMPRESSION:  Today: As per subjective, pt with increased right lower extremity lateral pain. PT to test single leg balance in which patient shows more unsteadiness on right lower extremity > left lower extremity. PT modified HEP to include single leg balance and lateral stability.   PT session and HEP adjusted to focus on right lateral lower extremity strength.   ------------------------------------------------------------------------------------------------------------------ Eval: Patient is a 77 y.o. y.o. female who was seen today for physical therapy evaluation and treatment for right knee pain, difficulty walking due to right hamstring strain. Patient presents to PT with the following objective impairments: decreased activity tolerance, decreased endurance, difficulty walking, and decreased strength. These impairments limit the patient in activities such as carrying, lifting, bending, sitting, standing, squatting, stairs, and toileting. These impairments also limit the patient in participation such as meal prep, cleaning, laundry, driving, community activity, and yard work. The patient will benefit from PT to address the limitations/impairments listed below to return to their prior level of function in the domains of activity and participation.    PERSONAL FACTORS:  n/a  are also affecting patient's functional outcome.   REHAB POTENTIAL: Excellent  CLINICAL DECISION MAKING: Stable/uncomplicated  EVALUATION COMPLEXITY: Low  --------------------------------------------------------------------------------------------- GOALS: Goals reviewed with patient? Yes  SHORT TERM GOALS: Target date: 06/13/2023    Patient will be able to complete the Timed Up and Go Test (TUG) within 18 seconds to improve gait speed to facilitate safe ambulation around home and community  Baseline:  22s Goal status: INITIAL  2.  Patient will score a >/=  60 / 80 on the  LEFS   to demonstrate an improvement in ADL completion, stair negotiation, household/community ambulation, and self-care Baseline: 54 Goal status: INITIAL  3. Patient will be independent with a basic stretching/strengthening HEP  Baseline:  Goal status: INITIAL   LONG TERM GOALS: Target date: 07/04/2023     Patient will be able to complete the Timed Up and Go Test (TUG) within 12 seconds to improve gait speed to facilitate safe ambulation around home and community Baseline: 22s Goal status: INITIAL  2.  Patient will score a >/=  65 / 80 on the LEFS  on the  LEFS  to demonstrate an improvement in ADL completion, stair negotiation, household/community ambulation, and self-care Baseline: 54 Goal status: INITIAL  3.  Patient will be independent with a comprehensive strengthening HEP  Baseline:  Goal status: INITIAL  --------------------------------------------------------------------------------------------- PLAN:  PT FREQUENCY: 2x/week  PT DURATION: 6 weeks  PLANNED INTERVENTIONS: Therapeutic exercises, Therapeutic activity, Neuromuscular re-education, Balance training, Gait training, Patient/Family education, Self Care, and Joint mobilization.  PLAN FOR NEXT SESSION: Progress right hamstring strengthening in standing    Seymour Bars, PT 07/08/2023, 1:24 PM

## 2023-07-19 DIAGNOSIS — M25561 Pain in right knee: Secondary | ICD-10-CM | POA: Diagnosis not present

## 2023-07-24 DIAGNOSIS — Z1289 Encounter for screening for malignant neoplasm of other sites: Secondary | ICD-10-CM | POA: Diagnosis not present

## 2023-07-24 DIAGNOSIS — K2950 Unspecified chronic gastritis without bleeding: Secondary | ICD-10-CM | POA: Diagnosis not present

## 2023-07-24 DIAGNOSIS — K3189 Other diseases of stomach and duodenum: Secondary | ICD-10-CM | POA: Diagnosis not present

## 2023-07-24 DIAGNOSIS — K862 Cyst of pancreas: Secondary | ICD-10-CM | POA: Diagnosis not present

## 2023-07-24 DIAGNOSIS — Z79899 Other long term (current) drug therapy: Secondary | ICD-10-CM | POA: Diagnosis not present

## 2023-07-24 DIAGNOSIS — K8689 Other specified diseases of pancreas: Secondary | ICD-10-CM | POA: Diagnosis not present

## 2023-07-24 DIAGNOSIS — Z800 Family history of malignant neoplasm of digestive organs: Secondary | ICD-10-CM | POA: Diagnosis not present

## 2023-07-25 ENCOUNTER — Ambulatory Visit (HOSPITAL_COMMUNITY): Payer: Medicare HMO | Attending: Orthopaedic Surgery

## 2023-07-25 DIAGNOSIS — S76311S Strain of muscle, fascia and tendon of the posterior muscle group at thigh level, right thigh, sequela: Secondary | ICD-10-CM | POA: Insufficient documentation

## 2023-07-25 DIAGNOSIS — M25561 Pain in right knee: Secondary | ICD-10-CM | POA: Insufficient documentation

## 2023-07-25 DIAGNOSIS — R262 Difficulty in walking, not elsewhere classified: Secondary | ICD-10-CM | POA: Diagnosis not present

## 2023-07-25 NOTE — Therapy (Signed)
[facility] OUTPATIENT PHYSICAL THERAPY lower extremity treatment  PHYSICAL THERAPY DISCHARGE SUMMARY  Visits from Start of Care: 8  Current functional level related to goals / functional outcomes: See below   Remaining deficits: See below    Education / Equipment: N/a   Patient agrees to discharge. Patient goals were met. Patient is being discharged due to meeting the stated rehab goals.       Patient Name: Margaret Murray MRN: [id number] DOB:1946-07-27, 77 y.o., female Today's Date: 07/25/2023  END OF SESSION:   PT End of Session - 07/25/23 1534     Visit Number 8    Number of Visits 12    Date for PT Re-Evaluation 07/04/23    Authorization Type [employer] MD HMO    Progress Note Due on Visit 10    PT Start Time 0230    PT Stop Time 0310    PT Time Calculation (min) 40 min    Activity Tolerance Patient tolerated treatment well    Behavior During Therapy [facility] for tasks assessed/performed                  No past medical history on file. Past Surgical History:  Procedure Laterality Date   ABDOMINAL HYSTERECTOMY     ARTHRODESIS METATARSALPHALANGEAL JOINT (MTPJ) Right 11/21/2021   Procedure: RIGHT FIRST METATARSOPHALANGEAL ARTHRODESIS;  Surgeon: Terance Hart, MD;  Location: [facility];  Service: Orthopedics;  Laterality: Right;  LENGHT OF SURGERY: 60 MINUTES   BREAST BIOPSY     BREAST EXCISIONAL BIOPSY Right    COLONOSCOPY N/A 01/23/2017   Procedure: COLONOSCOPY;  Surgeon: Corbin Ade, MD;  [facility];  Service: Endoscopy;  Laterality: N/A;  8:30   Patient Active Problem List   Diagnosis Date Noted   Right foot pain 11/21/2021    PCP: Fleet Contras, MDRef Provider (PCP)   REFERRING PROVIDER:   Marcene Corning, MD    REFERRING DIAG: HAMSTRING AND GASTROC STRAINS   Rationale for Evaluation and Treatment: Rehabilitation  THERAPY DIAG:  Difficulty in walking, not elsewhere classified  Acute pain of right knee  Strain  of right hamstring, sequela  ONSET DATE: 04/22/23 ---------------------------------------------------------------------------------------------                                                                                                                                                                                      SUBJECTIVE STATEMENT:  Today: Patient states she feels much better. States feeling 80% better but still c/o right lateral leg pain going down stairs    ------------------------------------------------------------------------------------------------------------------ Eval: In July 2024, patient was running from her home to the car and felt a  strong pull in her right lower extremity when arriving to the car. 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 Patient began to notice increased right HS pain.   PERTINENT HISTORY:  Right metatarsal fusion/ bunion surgery 2023  PAIN:  Are you having pain? Yes: NPRS scale: 3/10 Pain location: right lower extremity- lateral Pain description: dull/achy Aggravating factors: standing, walking, bending  Relieving factors: rest  PRECAUTIONS: None  RED FLAGS: None   WEIGHT BEARING RESTRICTIONS: No  FALLS:  Has patient fallen in last 6 months? 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   PLOF: Independent  PATIENT GOALS: to walk pain free  NEXT MD VISIT: 06/03/23 --------------------------------------------------------------------------------------------- OBJECTIVE:   DIAGNOSTIC FINDINGS:  N/A  PATIENT SURVEYS:  LEFS Lower Extremity Functional Score: 54 / 80 = 67.5 %  10/17 Lower Extremity Functional Scale (LEFS) 60/80   SCREENING FOR RED FLAGS: Bowel or bladder incontinence: No Spinal tumors: No Cauda equina syndrome: No Compression  fracture: No Abdominal aneurysm: No  COGNITION: Overall cognitive status: Within functional limits for tasks assessed  POSTURE: increased lumbar lordosis and weight shift left      FUNCTIONAL TESTS:  5 times sit to stand: 22s; pt with increased pain; wt shift to the left lower extremity    GAIT ANALYSIS: Distance walked: 38ft Assistive device utilized: None Level of assistance: Complete Independence Comments: patient with antalgia on right lower extremity; wt shift to the left  SENSATION: Light touch: WFL   LOWER EXTREMITY MMT:    Bilateral lower extremity grossly 4-/5 except;  right knee flexion 3/5 with pain Right ankle PF 3+/5 with pain   LOWER EXTREMITY ROM:     Bilateral lower extremity ROM are WFL  (05/28/23) right great toe extension 0* due to fusion   PALPATION: Minimal tenderness to palpation right hamstrings --------------------------------------------------------------------------------------------- TODAY'S TREATMENT:                                                                                                                              DATE:   07/25/23 PT discharge  HEP review /demonstration  Self-care/ ADLs including activity modification, pain management, orthotic    07/08/23 Therapeutic exercise x 40' -HEP Review - Side Stepping with Resistance at Ankles  - 20 reps - Standing Hip Flexion with Resistance Loop  - 20 reps - Standing Hip Abduction Kicks  - 20 reps  06/14/23 Therapeutic exercise x 40'  -Standing partial lunges in // bars 2x10, bilateral  -Standing lunges to 6" step in // bars 2x10, bilateral  -Ankle D2 eversion/dorsiflexion versus red theraband 3x10    06/06/23 Therapeutic Activity x22min -Step up/downs on 4" steps in //bars -Partial lunges to 6" step + foam pad in // bars, x10, bilateral  Neuromuscular re-education x15 -Deadlifts with 15# to 6' step,  stiff legged, bent legged 3x10     PATIENT EDUCATION:  Education  details: Patient educated on hamstring injury, pain management, HEP Person educated: Patient Education method: Medical illustrator Education comprehension: verbalized understanding  HOME EXERCISE PROGRAM: Access Code: [email or web address] URL: [email or web address] Date: 07/08/2023 Prepared by: Seymour Bars  Exercises - Figure 4 Bridge  - 20 reps - Half Deadlift with Kettlebell  - 20 reps - Side Stepping with Resistance at Ankles  - 20 reps - Standing Hip Flexion with Resistance Loop  - 20 reps - Standing Hip Abduction Kicks  - 20 reps --------------------------------------------------------------------------------------------- ASSESSMENT:  CLINICAL IMPRESSION:  Today: PT session focused on HEP review/demonstration to prepare for patient discharge to HEP. Patient able to demo HEP with GOOD understanding. PT reviewed activity modification/ pain management of the knee.   ------------------------------------------------------------------------------------------------------------------ Eval: Patient is a 77 y.o. y.o. female who was seen today for physical therapy evaluation and treatment for right knee pain, difficulty walking due to right hamstring strain. Patient presents to PT with the following objective impairments: decreased activity tolerance, decreased endurance, difficulty walking, and decreased strength. These impairments limit the patient in activities such as carrying, lifting, bending, sitting, standing, squatting, stairs, and toileting. These impairments also limit the patient in participation such as meal prep, cleaning, laundry, driving, community activity, and yard work. The patient will benefit from PT to address the limitations/impairments listed below to return to their prior level of function in the domains of activity and participation.    PERSONAL FACTORS:  n/a  are also affecting patient's functional outcome.   REHAB POTENTIAL: Excellent  CLINICAL  DECISION MAKING: Stable/uncomplicated  EVALUATION COMPLEXITY: Low  --------------------------------------------------------------------------------------------- GOALS: Goals reviewed with patient? Yes  SHORT TERM GOALS: Target date: 06/13/2023    Patient will be able to complete the Timed Up and Go Test (TUG) within 18 seconds to improve gait speed to facilitate safe ambulation around home and community  Baseline:  Goal status: goal met  2.  Patient will score a >/=  60 / 80 on the  LEFS   to demonstrate an improvement in ADL completion, stair negotiation, household/community ambulation, and self-care Baseline: 54 Goal status: goal met  3. Patient will be independent with a basic stretching/strengthening HEP  Baseline:  Goal status: goal met   LONG TERM GOALS: Target date: 07/04/2023     Patient will be able to complete the Timed Up and Go Test (TUG) within 12 seconds to improve gait speed to facilitate safe ambulation around home and community Baseline:  Goal status: in progress  2.  Patient will score a >/=  65 / 80 on the LEFS  on the  LEFS  to demonstrate an improvement in ADL completion, stair negotiation, household/community ambulation, and self-care Baseline: 54 Goal status: in progress  3.  Patient will be independent with a comprehensive strengthening HEP  Baseline:  Goal status: goal met  --------------------------------------------------------------------------------------------- PLAN:  PT FREQUENCY: 2x/week  PT DURATION: 6 weeks  PLANNED INTERVENTIONS: Therapeutic exercises, Therapeutic activity, Neuromuscular re-education, Balance training, Gait training, Patient/Family education, Self Care, and Joint mobilization.  PLAN FOR NEXT SESSION: Progress right hamstring strengthening in standing    Seymour Bars, PT 07/25/2023, 3:35 PM

## 2023-08-07 DIAGNOSIS — M79671 Pain in right foot: Secondary | ICD-10-CM | POA: Diagnosis not present

## 2023-08-13 DIAGNOSIS — Z13220 Encounter for screening for lipoid disorders: Secondary | ICD-10-CM | POA: Diagnosis not present

## 2023-08-13 DIAGNOSIS — R7303 Prediabetes: Secondary | ICD-10-CM | POA: Diagnosis not present

## 2023-08-13 DIAGNOSIS — Z0000 Encounter for general adult medical examination without abnormal findings: Secondary | ICD-10-CM | POA: Diagnosis not present

## 2023-08-13 DIAGNOSIS — S8391XD Sprain of unspecified site of right knee, subsequent encounter: Secondary | ICD-10-CM | POA: Diagnosis not present

## 2023-12-09 ENCOUNTER — Other Ambulatory Visit: Payer: Self-pay | Admitting: Internal Medicine

## 2023-12-09 DIAGNOSIS — E2839 Other primary ovarian failure: Secondary | ICD-10-CM

## 2024-01-20 ENCOUNTER — Other Ambulatory Visit: Payer: Self-pay | Admitting: Internal Medicine

## 2024-01-20 DIAGNOSIS — Z1231 Encounter for screening mammogram for malignant neoplasm of breast: Secondary | ICD-10-CM

## 2024-02-24 ENCOUNTER — Ambulatory Visit

## 2024-04-01 ENCOUNTER — Ambulatory Visit

## 2024-04-07 ENCOUNTER — Ambulatory Visit
Admission: RE | Admit: 2024-04-07 | Discharge: 2024-04-07 | Disposition: A | Discharge status: Home / Self Care | Source: Ambulatory Visit | Attending: Internal Medicine | Admitting: Internal Medicine

## 2024-04-07 DIAGNOSIS — Z1231 Encounter for screening mammogram for malignant neoplasm of breast: Secondary | ICD-10-CM

## 2024-07-09 ENCOUNTER — Ambulatory Visit: Payer: Self-pay | Admitting: Nurse Practitioner

## 2024-07-09 ENCOUNTER — Ambulatory Visit
Admission: RE | Admit: 2024-07-09 | Discharge: 2024-07-09 | Disposition: A | Discharge status: Home / Self Care | Source: Ambulatory Visit | Attending: Nurse Practitioner | Admitting: Nurse Practitioner

## 2024-07-09 ENCOUNTER — Ambulatory Visit (HOSPITAL_COMMUNITY)
Admission: RE | Admit: 2024-07-09 | Discharge: 2024-07-09 | Disposition: A | Discharge status: Home / Self Care | Source: Ambulatory Visit | Attending: Nurse Practitioner | Admitting: Nurse Practitioner

## 2024-07-09 VITALS — BP 165/65 | HR 76 | Temp 97.8°F | Resp 20

## 2024-07-09 DIAGNOSIS — R051 Acute cough: Secondary | ICD-10-CM

## 2024-07-09 DIAGNOSIS — J449 Chronic obstructive pulmonary disease, unspecified: Secondary | ICD-10-CM | POA: Diagnosis present

## 2024-07-09 MED ORDER — GUAIFENESIN ER 600 MG PO TB12: 600.0000 mg | ORAL_TABLET | Freq: Two times a day (BID) | ORAL | 0 refills | Status: AC | PRN

## 2024-07-09 MED ORDER — BENZONATATE 100 MG PO CAPS: 100.0000 mg | ORAL_CAPSULE | Freq: Three times a day (TID) | ORAL | 0 refills | Status: AC | PRN

## 2024-07-09 NOTE — ED Provider Notes (Signed)
 [facility]    CSN: [id number] Arrival date & time: 07/09/24  1327      History   Chief Complaint No chief complaint on file.   HPI Margaret Murray is a 78 y.o. female.   Patient presents today with 10 day history of feeling hot, chest does not feel 100%, nasal congestion and congestion that is worse in the morning, sinus pressure that is now improved headache, nausea without vomiting, and fatigue.  She denies know fevers, cough, shortness of breath or chest pain, chest tightness, ear pain, vomiting, diarrhea, and change in appetite.  Has been taking Mucinex, Emergen-C, echinacea, raw garlic, and drinking lots of water  for symptoms with mild improvement.   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    History reviewed. No pertinent past medical history.  Patient Active Problem List   Diagnosis Date Noted   Right foot pain 11/21/2021    Past Surgical History:  Procedure Laterality Date   ABDOMINAL HYSTERECTOMY     ARTHRODESIS METATARSALPHALANGEAL JOINT (MTPJ) Right 11/21/2021   Procedure: RIGHT FIRST METATARSOPHALANGEAL ARTHRODESIS;  Surgeon: Elsa Lonni SAUNDERS, MD;  Location: [facility];  Service: Orthopedics;  Laterality: Right;  LENGHT OF SURGERY: 60 MINUTES   BREAST BIOPSY     BREAST EXCISIONAL BIOPSY Right    COLONOSCOPY N/A 01/23/2017   Procedure: COLONOSCOPY;  Surgeon: Lamar CHRISTELLA Hollingshead, MD;  [facility];  Service: Endoscopy;  Laterality: N/A;  8:30    OB History   No obstetric history on file.      Home Medications    Prior to Admission medications   Medication Sig Start Date End Date Taking? Authorizing Provider  benzonatate (TESSALON) 100 MG capsule Take 1 capsule (100 mg total) by mouth 3 (three) times daily as needed for cough. Do not take with alcohol or while operating or driving heavy machinery 89/7/74  Yes Chandra Raisin A, NP  guaiFENesin (MUCINEX) 600  MG 12 hr tablet Take 1 tablet (600 mg total) by mouth 2 (two) times daily as needed for cough or to loosen phlegm. 07/09/24  Yes Chandra Raisin LABOR, NP  Cholecalciferol (VITAMIN D3 PO) Take 1 capsule by mouth daily.    [provider]  Echinacea 450 MG CAPS Take 450 mg by mouth daily.    [provider]  Hyaluronic Acid-Vitamin C (HYALURONIC ACID PO) Take 2 capsules by mouth daily.    [provider]  Lutein 20 MG TABS Take 20 mg by mouth daily.    [provider]  magnesium citrate SOLN Take 1 Bottle by mouth once.    [provider]  Probiotic Product (PROBIOTIC-10 PO) Take by mouth.    [provider]  TURMERIC PO Take 600 mg by mouth daily.    [provider]    Family History Family History  Problem Relation Age of Onset   Diabetes Mother    Dementia Mother    Pancreatic cancer Father    Hyperthyroidism Sister    Hypertension Sister    Crohn's disease Brother    Multiple sclerosis Sister    Breast cancer Neg Hx     Social History Social History   Tobacco Use   Smoking status: Never   Smokeless tobacco: Never  Substance Use Topics   Alcohol use: No   Drug use: No     Allergies   Patient has no known allergies.   Review of Systems  Review of Systems Per HPI  Physical Exam Triage Vital Signs ED Triage Vitals  Encounter Vitals Group     BP 07/09/24 1355 (!) 165/65     Girls Systolic BP Percentile --      Girls Diastolic BP Percentile --      Boys Systolic BP Percentile --      Boys Diastolic BP Percentile --      Pulse Rate 07/09/24 1355 76     Resp 07/09/24 1355 20     Temp 07/09/24 1355 97.8 F (36.6 C)     Temp Source 07/09/24 1355 Oral     SpO2 07/09/24 1355 96 %     Weight --      Height --      Head Circumference --      Peak Flow --      Pain Score 07/09/24 1336 0     Pain Loc --      Pain Education --      Exclude from Growth Chart --    No data found.  Updated Vital Signs BP  (!) 165/65 (BP Location: Right Arm)   Pulse 76   Temp 97.8 F (36.6 C) (Oral)   Resp 20   SpO2 96%   Visual Acuity Right Eye Distance:   Left Eye Distance:   Bilateral Distance:    Right Eye Near:   Left Eye Near:    Bilateral Near:     Physical Exam Vitals and nursing note reviewed.  Constitutional:      General: She is not in acute distress.    Appearance: Normal appearance. She is not ill-appearing or toxic-appearing.  HENT:     Head: Normocephalic and atraumatic.     Right Ear: Tympanic membrane, ear canal and external ear normal.     Left Ear: Tympanic membrane, ear canal and external ear normal.     Nose: No congestion or rhinorrhea.     Right Sinus: No maxillary sinus tenderness or frontal sinus tenderness.     Left Sinus: No maxillary sinus tenderness or frontal sinus tenderness.     Mouth/Throat:     Mouth: Mucous membranes are moist.     Pharynx: Oropharynx is clear. No oropharyngeal exudate or posterior oropharyngeal erythema.  Eyes:     General: No scleral icterus.    Extraocular Movements: Extraocular movements intact.  Cardiovascular:     Rate and Rhythm: Normal rate and regular rhythm.  Pulmonary:     Effort: Pulmonary effort is normal. No respiratory distress.     Breath sounds: Examination of the left-upper field reveals rhonchi. Rhonchi present. No wheezing or rales.     Comments: Intermittent rhonchi left mid and upper lung; improved with multiple breaths Musculoskeletal:     Cervical back: Normal range of motion and neck supple.  Lymphadenopathy:     Cervical: No cervical adenopathy.  Skin:    General: Skin is warm and dry.     Coloration: Skin is not jaundiced or pale.     Findings: No erythema or rash.  Neurological:     Mental Status: She is alert and oriented to person, place, and time.  Psychiatric:        Behavior: Behavior is cooperative.      UC Treatments / Results  Labs (all labs ordered are listed, but only abnormal results are  displayed) Labs Reviewed - No data to display  EKG   Radiology DG Chest 2 View Result Date: 07/09/2024 CLINICAL DATA:  chest congestion x 10 days. EXAM: CHEST - 2 VIEW COMPARISON:  None Available. FINDINGS: Bilateral lungs appear hyperexpanded and hyperlucent with coarse bronchovascular markings, in keeping with COPD. Bilateral lungs otherwise appear clear. No dense consolidation or lung collapse. Bilateral costophrenic angles are clear. Normal cardio-mediastinal silhouette. No acute osseous abnormalities. The soft tissues are within normal limits. IMPRESSION: No active cardiopulmonary disease. COPD. Electronically Signed   By: Ree Molt M.D.   On: 07/09/2024 15:25    Procedures Procedures (including critical care time)  Medications Ordered in UC Medications - No data to display  Initial Impression / Assessment and Plan / UC Course  I have reviewed the triage vital signs and the nursing notes.  Pertinent labs & imaging results that were available during my care of the patient were reviewed by me and considered in my medical decision making (see chart for details).   Patient is mildly hypertensive in urgent care today, otherwise vital signs are stable.  1. Acute cough Vitals and exam are reassuring today, I did hear intermittent rhonchorous breath sounds to the upper left lobe, therefore ordered an outpatient chest x-ray to evaluate for pneumonia Suspect viral etiology in nature, however viral testing deferred given length of symptoms Supportive care discussed with patient in the meantime; guaifenesin and cough suppressant medication Return and ER precautions also discussed  Update: Chest x-ray is negative for pneumonia, but does show COPD.  We discussed findings and I recommended follow-up with primary care provider if symptoms persist.  All questions answered to this mobility.  See result note.  The patient was given the opportunity to ask questions.  All questions answered to  their satisfaction.  The patient is in agreement to this plan.   Final Clinical Impressions(s) / UC Diagnoses   Final diagnoses:  Acute cough     Discharge Instructions      You have an upper respiratory infection.  Please go to [facility] today to have a chest xray performed to check for pneumonia.  If there is no pneumonia, symptoms should improve over the next week to 10 days.  If you develop chest pain or shortness of breath, go to the emergency room.  Some things that can make you feel better are: - Increased rest - Increasing fluid with water /sugar free electrolytes - Acetaminophen  and ibuprofen as needed for fever/pain - OTC guaifenesin (Mucinex) 600 mg twice daily for congestion - Saline sinus flushes or a neti pot - Humidifying the air     ED Prescriptions     Medication Sig Dispense Auth. Provider   guaiFENesin (MUCINEX) 600 MG 12 hr tablet Take 1 tablet (600 mg total) by mouth 2 (two) times daily as needed for cough or to loosen phlegm. 30 tablet Chandra Raisin A, NP   benzonatate (TESSALON) 100 MG capsule Take 1 capsule (100 mg total) by mouth 3 (three) times daily as needed for cough. Do not take with alcohol or while operating or driving heavy machinery 21 capsule Chandra Raisin LABOR, NP      PDMP not reviewed this encounter.   Chandra Raisin LABOR, NP 07/09/24 [phone]

## 2024-07-09 NOTE — Discharge Instructions (Signed)
 You have an upper respiratory infection.  Please go to [facility] today to have a chest xray performed to check for pneumonia.  If there is no pneumonia, symptoms should improve over the next week to 10 days.  If you develop chest pain or shortness of breath, go to the emergency room.  Some things that can make you feel better are: - Increased rest - Increasing fluid with water /sugar free electrolytes - Acetaminophen  and ibuprofen as needed for fever/pain - OTC guaifenesin (Mucinex) 600 mg twice daily for congestion - Saline sinus flushes or a neti pot - Humidifying the air

## 2024-07-09 NOTE — ED Triage Notes (Signed)
 Pt reports fatigue, sinus drainage, nausea, some headache, chest congestion x 10 days. Has OTC meds.

## 2024-07-13 ENCOUNTER — Inpatient Hospital Stay (HOSPITAL_BASED_OUTPATIENT_CLINIC_OR_DEPARTMENT_OTHER): Admission: RE | Admit: 2024-07-13 | Source: Ambulatory Visit

## 2024-08-06 ENCOUNTER — Other Ambulatory Visit

## 2024-08-27 ENCOUNTER — Ambulatory Visit (HOSPITAL_BASED_OUTPATIENT_CLINIC_OR_DEPARTMENT_OTHER)
Admission: RE | Admit: 2024-08-27 | Discharge: 2024-08-27 | Disposition: A | Discharge status: Home / Self Care | Source: Ambulatory Visit | Attending: Internal Medicine | Admitting: Internal Medicine

## 2024-08-27 DIAGNOSIS — E2839 Other primary ovarian failure: Secondary | ICD-10-CM | POA: Diagnosis present
